# Patient Record
Sex: Female | Born: 1955
Health system: Southern US, Community
[De-identification: ages and names within clinical notes are randomized; demographics above are authoritative.]

## PROBLEM LIST (undated history)

## (undated) DIAGNOSIS — N921 Excessive and frequent menstruation with irregular cycle: Secondary | ICD-10-CM

## (undated) DIAGNOSIS — R5382 Chronic fatigue, unspecified: Secondary | ICD-10-CM

## (undated) DIAGNOSIS — I10 Essential (primary) hypertension: Secondary | ICD-10-CM

## (undated) DIAGNOSIS — R7303 Prediabetes: Secondary | ICD-10-CM

## (undated) DIAGNOSIS — E782 Mixed hyperlipidemia: Secondary | ICD-10-CM

## (undated) DIAGNOSIS — E669 Obesity, unspecified: Secondary | ICD-10-CM

## (undated) DIAGNOSIS — M419 Scoliosis, unspecified: Secondary | ICD-10-CM

## (undated) HISTORY — DX: Prediabetes: R73.03

## (undated) HISTORY — DX: Mixed hyperlipidemia: E78.2

## (undated) HISTORY — DX: Chronic fatigue, unspecified: R53.82

## (undated) HISTORY — DX: Excessive and frequent menstruation with irregular cycle: N92.1

## (undated) HISTORY — PX: WISDOM TOOTH EXTRACTION: SHX21

## (undated) HISTORY — PX: COLONOSCOPY: SHX174

## (undated) HISTORY — DX: Scoliosis, unspecified: M41.9

## (undated) HISTORY — PX: TUBAL LIGATION: SHX77

## (undated) HISTORY — DX: Obesity, unspecified: E66.9

---

## 1998-08-21 ENCOUNTER — Ambulatory Visit (HOSPITAL_COMMUNITY): Admission: RE | Admit: 1998-08-21 | Discharge: 1998-08-21 | Payer: Self-pay | Admitting: Family Medicine

## 1998-08-21 ENCOUNTER — Encounter: Payer: Self-pay | Admitting: Family Medicine

## 1999-10-26 ENCOUNTER — Encounter: Payer: Self-pay | Admitting: Specialist

## 1999-10-26 ENCOUNTER — Emergency Department (HOSPITAL_COMMUNITY): Admission: EM | Admit: 1999-10-26 | Discharge: 1999-10-26 | Payer: Self-pay | Admitting: Emergency Medicine

## 1999-10-26 ENCOUNTER — Encounter: Payer: Self-pay | Admitting: Emergency Medicine

## 1999-11-28 ENCOUNTER — Encounter: Admission: RE | Admit: 1999-11-28 | Discharge: 1999-11-28 | Payer: Self-pay | Admitting: Specialist

## 1999-11-28 ENCOUNTER — Encounter: Payer: Self-pay | Admitting: Specialist

## 2000-10-08 ENCOUNTER — Encounter: Payer: Self-pay | Admitting: Family Medicine

## 2000-10-08 ENCOUNTER — Ambulatory Visit (HOSPITAL_COMMUNITY): Admission: RE | Admit: 2000-10-08 | Discharge: 2000-10-08 | Payer: Self-pay | Admitting: Family Medicine

## 2000-11-17 ENCOUNTER — Other Ambulatory Visit: Admission: RE | Admit: 2000-11-17 | Discharge: 2000-11-17 | Payer: Self-pay | Admitting: Emergency Medicine

## 2002-03-15 ENCOUNTER — Encounter: Payer: Self-pay | Admitting: Family Medicine

## 2002-03-15 ENCOUNTER — Ambulatory Visit (HOSPITAL_COMMUNITY): Admission: RE | Admit: 2002-03-15 | Discharge: 2002-03-15 | Payer: Self-pay | Admitting: Family Medicine

## 2003-07-04 ENCOUNTER — Ambulatory Visit (HOSPITAL_COMMUNITY): Admission: RE | Admit: 2003-07-04 | Discharge: 2003-07-04 | Payer: Self-pay | Admitting: Family Medicine

## 2003-07-05 ENCOUNTER — Other Ambulatory Visit: Admission: RE | Admit: 2003-07-05 | Discharge: 2003-07-05 | Payer: Self-pay | Admitting: Family Medicine

## 2004-11-01 ENCOUNTER — Other Ambulatory Visit: Admission: RE | Admit: 2004-11-01 | Discharge: 2004-11-01 | Payer: Self-pay | Admitting: Family Medicine

## 2005-08-27 ENCOUNTER — Ambulatory Visit (HOSPITAL_COMMUNITY): Admission: RE | Admit: 2005-08-27 | Discharge: 2005-08-27 | Payer: Self-pay | Admitting: Family Medicine

## 2005-12-09 ENCOUNTER — Other Ambulatory Visit: Admission: RE | Admit: 2005-12-09 | Discharge: 2005-12-09 | Payer: Self-pay | Admitting: Family Medicine

## 2007-03-03 ENCOUNTER — Other Ambulatory Visit: Admission: RE | Admit: 2007-03-03 | Discharge: 2007-03-03 | Payer: Self-pay | Admitting: Family Medicine

## 2007-09-01 ENCOUNTER — Ambulatory Visit (HOSPITAL_COMMUNITY): Admission: RE | Admit: 2007-09-01 | Discharge: 2007-09-01 | Payer: Self-pay | Admitting: Family Medicine

## 2008-06-21 ENCOUNTER — Other Ambulatory Visit: Admission: RE | Admit: 2008-06-21 | Discharge: 2008-06-21 | Payer: Self-pay | Admitting: Family Medicine

## 2009-09-24 ENCOUNTER — Ambulatory Visit (HOSPITAL_COMMUNITY): Admission: RE | Admit: 2009-09-24 | Discharge: 2009-09-24 | Payer: Self-pay | Admitting: Family Medicine

## 2010-02-06 ENCOUNTER — Other Ambulatory Visit: Admission: RE | Admit: 2010-02-06 | Discharge: 2010-02-06 | Payer: Self-pay | Admitting: Family Medicine

## 2011-02-12 ENCOUNTER — Other Ambulatory Visit (HOSPITAL_COMMUNITY)
Admission: RE | Admit: 2011-02-12 | Discharge: 2011-02-12 | Disposition: A | Discharge status: Home / Self Care | Payer: BC Managed Care – PPO | Source: Ambulatory Visit | Attending: Family Medicine | Admitting: Family Medicine

## 2011-02-12 ENCOUNTER — Other Ambulatory Visit: Payer: Self-pay | Admitting: Family Medicine

## 2011-02-12 DIAGNOSIS — Z124 Encounter for screening for malignant neoplasm of cervix: Secondary | ICD-10-CM | POA: Insufficient documentation

## 2011-11-25 ENCOUNTER — Other Ambulatory Visit (HOSPITAL_COMMUNITY): Payer: Self-pay | Admitting: Family Medicine

## 2011-11-25 DIAGNOSIS — Z1231 Encounter for screening mammogram for malignant neoplasm of breast: Secondary | ICD-10-CM

## 2011-12-16 ENCOUNTER — Ambulatory Visit (HOSPITAL_COMMUNITY)
Admission: RE | Admit: 2011-12-16 | Discharge: 2011-12-16 | Disposition: A | Discharge status: Home / Self Care | Payer: BC Managed Care – PPO | Source: Ambulatory Visit | Attending: Family Medicine | Admitting: Family Medicine

## 2011-12-16 DIAGNOSIS — Z1231 Encounter for screening mammogram for malignant neoplasm of breast: Secondary | ICD-10-CM | POA: Insufficient documentation

## 2013-08-26 ENCOUNTER — Other Ambulatory Visit (HOSPITAL_COMMUNITY): Payer: Self-pay | Admitting: Family Medicine

## 2013-08-26 DIAGNOSIS — Z1231 Encounter for screening mammogram for malignant neoplasm of breast: Secondary | ICD-10-CM

## 2013-09-02 ENCOUNTER — Other Ambulatory Visit (HOSPITAL_COMMUNITY)
Admission: RE | Admit: 2013-09-02 | Discharge: 2013-09-02 | Disposition: A | Discharge status: Home / Self Care | Payer: BC Managed Care – PPO | Source: Ambulatory Visit | Attending: Family Medicine | Admitting: Family Medicine

## 2013-09-02 ENCOUNTER — Other Ambulatory Visit: Payer: Self-pay | Admitting: Family Medicine

## 2013-09-02 DIAGNOSIS — Z124 Encounter for screening for malignant neoplasm of cervix: Secondary | ICD-10-CM | POA: Insufficient documentation

## 2013-09-02 DIAGNOSIS — Z1151 Encounter for screening for human papillomavirus (HPV): Secondary | ICD-10-CM | POA: Insufficient documentation

## 2013-09-06 ENCOUNTER — Ambulatory Visit (HOSPITAL_COMMUNITY): Payer: BC Managed Care – PPO | Attending: Family Medicine

## 2014-02-07 ENCOUNTER — Ambulatory Visit (HOSPITAL_COMMUNITY)
Admission: RE | Admit: 2014-02-07 | Discharge: 2014-02-07 | Disposition: A | Discharge status: Home / Self Care | Payer: BC Managed Care – PPO | Source: Ambulatory Visit | Attending: Family Medicine | Admitting: Family Medicine

## 2014-02-07 DIAGNOSIS — Z1231 Encounter for screening mammogram for malignant neoplasm of breast: Secondary | ICD-10-CM | POA: Insufficient documentation

## 2014-03-20 ENCOUNTER — Other Ambulatory Visit: Payer: Self-pay | Admitting: Obstetrics & Gynecology

## 2014-03-26 NOTE — H&P (Signed)
Mary Cordova is an 58 y.o. G1P1 postmenopausal female who presents for hysteroscopy, D&C and Truclear polypectomy.  The patient reported an episode of postmenopausal bleeding in May where she had a "regular period" though slightly heavier and longer than normal. Bleeding lasted 7 days using ~ 6 pads per day with passage of grape-sized clots. The following month around the same time she had 1 day of light bleeding, no further bleeding since that time. An ultrasound was performed that showed an anteverted uterus measuring 9x5.3x5.3cm, endometrium appears thickened measuring 0.89cm by US. Three small fibroids, all smaller than 2cm. Cervix thickend with 2 hyperechoic masses 0.9x0.6cm and 1.2cm. Ovaries unremarkable bilaterally. SHG also performed- Multiple hyperechoic masses seen: anterio rwall 2.2x0.9cm, posterior fundal wall 1.1x0.6cm, LUS/cervical canal with 2 masses measuring 0.8x0.6cm and 1.3x1.1cm. Endometrial walls thin- total thickness 3.607mm. An EMB was performed that was negative for malignancy or hyperplasia. Patient presents today for management of the abnormal findings on ultrasound.  Pertinent Gynecological History: Menses: post-menopausal Bleeding: post menopausal bleeding Contraception: none DES exposure: denies Blood transfusions: none Sexually transmitted diseases: no past history Previous GYN Procedures: none  Last mammogram: normal Date: Sept 2015 Last pap: normal Date: April 2015 OB History: G1, P1   Medical History: Hypertension, Scoliosis, Elevated Cholesterol.       Medications: Taking Losartan Potassium 50 MG Tablet 1 tablet Once a day       Allergies: Penicillin (for allergy): unknown, Sulfa drugs (for allergy): unknown, ACE: cough   Surgical History: C section 1983, tubal ligation 1998.       Hospitalization/Major Diagnostic Procedure: childbirth 06/20/1981.        Family History: Father: deceased 3051 yrs, car accidentMother: deceased 9960 yrs, (+) ovarian/lung  cancerBrother 1: alive 72 yrs, A + WSister 1: alive 75 yrs, HTN, diabetes1 brother(s) , 1 sister(s) - healthy. 1daughter(s) - healthy.  sibs (+) CAD. sister - diabetes, hypertension. neg. f. hx. breast/colon cancer.       Social History:  General:  Tobacco use  cigarettes: Never smoked Tobacco history last updated 03/10/2014 no Smoking.  no Tobacco Exposure.  Alcohol: yes, liquor, Rare.  Caffeine: yes, 3 cups/day, coffee.  no Recreational drug use, no.  Diet: regular.  Exercise: yes, 3 times weekly, cardio, weights.  Occupation: employed, Psychologist, occupationalBanker.  Education: [employer].  Marital Status: married, Married.  Children: 1, girl.  Firearms: no.  Seat belt use: yes, always.     Review of Systems  Constitutional: Negative for fever, chills, weight loss and malaise/fatigue.  HENT: Negative for congestion.   Eyes: Negative for blurred vision and double vision.  Respiratory: Negative for cough, shortness of breath and wheezing.   Cardiovascular: Negative for chest pain and palpitations.  Gastrointestinal: Negative for heartburn, nausea, vomiting, abdominal pain, diarrhea and constipation.  Skin: Negative for itching and rash.  Neurological: Negative for dizziness and headaches.    Examination performed in office: 10/28 Physical Exam  Constitutional: She is oriented to person, place, and time. She appears well-developed and well-nourished.  HENT:  Head: Normocephalic and atraumatic.  Eyes: EOM are normal.  Cardiovascular: Normal rate and regular rhythm.   Respiratory: Effort normal and breath sounds normal. No respiratory distress.  GI: Soft. She exhibits no distension. There is no tenderness. There is no rebound and no guarding.  Genitourinary:  No external lesions, Vagina - pink moist mucosa, no lesions or abnormal discharge, cervix - closed, no discharge or lesions. No CMT. No adnexal masses bilaterally. Uterus: nontender and normal size on  palpation.    Musculoskeletal:  Normal range of motion. She exhibits no edema.  Neurological: She is alert and oriented to person, place, and time.  Skin: Skin is warm and dry.  Psychiatric: She has a normal mood and affect.    10/23: BMET: 137/4.1, 105/26, 15/0.6, glucose: 150 11/6: EMB negative, no evidence of hyperplasia or malignancy  Assessment/Plan: 58yo G1P1 postmenpausal female who presents for hysteroscopy, D&C and polypectomy with TruClear -NPO -LR @ 125cc/hr -Cefotan 2g IV to OR -SCDs to OR -Risk, benefit and indications reviewed with patient including risk of bleeding, infection and uterine perforation.  Pt aware and wishes to proceed with procedure.  Myna HidalgoJennifer Priscilla Finklea, DO [phone] (pager) [phone] (office)

## 2014-03-30 ENCOUNTER — Encounter (HOSPITAL_COMMUNITY): Payer: Self-pay

## 2014-03-30 ENCOUNTER — Other Ambulatory Visit: Payer: Self-pay

## 2014-03-30 ENCOUNTER — Encounter (HOSPITAL_COMMUNITY)
Admission: RE | Admit: 2014-03-30 | Discharge: 2014-03-30 | Disposition: A | Discharge status: Home / Self Care | Payer: BC Managed Care – PPO | Source: Ambulatory Visit | Attending: Obstetrics & Gynecology | Admitting: Obstetrics & Gynecology

## 2014-03-30 DIAGNOSIS — N854 Malposition of uterus: Secondary | ICD-10-CM | POA: Diagnosis not present

## 2014-03-30 DIAGNOSIS — N840 Polyp of corpus uteri: Secondary | ICD-10-CM | POA: Diagnosis not present

## 2014-03-30 DIAGNOSIS — N950 Postmenopausal bleeding: Secondary | ICD-10-CM | POA: Diagnosis present

## 2014-03-30 DIAGNOSIS — M419 Scoliosis, unspecified: Secondary | ICD-10-CM | POA: Diagnosis not present

## 2014-03-30 DIAGNOSIS — E780 Pure hypercholesterolemia: Secondary | ICD-10-CM | POA: Diagnosis not present

## 2014-03-30 DIAGNOSIS — I10 Essential (primary) hypertension: Secondary | ICD-10-CM | POA: Diagnosis not present

## 2014-03-30 HISTORY — DX: Essential (primary) hypertension: I10

## 2014-03-30 LAB — CBC
HCT: 38.4 % (ref 36.0–46.0)
HEMOGLOBIN: 13 g/dL (ref 12.0–15.0)
MCH: 29.5 pg (ref 26.0–34.0)
MCHC: 33.9 g/dL (ref 30.0–36.0)
MCV: 87.3 fL (ref 78.0–100.0)
Platelets: 251 10*3/uL (ref 150–400)
RBC: 4.4 MIL/uL (ref 3.87–5.11)
RDW: 14 % (ref 11.5–15.5)
WBC: 7.3 10*3/uL (ref 4.0–10.5)

## 2014-03-30 LAB — BASIC METABOLIC PANEL
Anion gap: 15 (ref 5–15)
BUN: 14 mg/dL (ref 6–23)
CO2: 23 meq/L (ref 19–32)
Calcium: 9.3 mg/dL (ref 8.4–10.5)
Chloride: 101 mEq/L (ref 96–112)
Creatinine, Ser: 0.5 mg/dL (ref 0.50–1.10)
GFR calc non Af Amer: >90 mL/min (ref >90)
GLUCOSE: 116 mg/dL — AB (ref 70–99)
Potassium: 4.3 mEq/L (ref 3.7–5.3)
Sodium: 139 mEq/L (ref 137–147)

## 2014-03-30 MED ORDER — DEXTROSE 5 % IV SOLN
2.0000 g | INTRAVENOUS | Status: AC
  Administered 2014-03-31: 2 g via INTRAVENOUS
  Filled 2014-03-30: qty 2

## 2014-03-30 NOTE — Patient Instructions (Addendum)
   Your procedure is scheduled on: Friday, Nov 13th  [facility] at: 10 AM Pick up the phone at the desk and dial [phone] and inform us of your arrival.  Please call this number if you have any problems the morning of surgery: [phone]  Remember: Do not eat or drink after midnight: Tonight (Thursday) Take these medicines the morning of surgery with a SIP OF WATER:  losartan  Do not wear jewelry, make-up, or FINGER nail polish No metal in your hair or on your body. Do not wear lotions, powders, perfumes.  You may wear deodorant.  Do not bring valuables to the hospital. Contacts, dentures or bridgework may not be worn into surgery.  Patients discharged on the day of surgery will not be allowed to drive home.  Home with husband Doug cell [phone].

## 2014-03-31 ENCOUNTER — Ambulatory Visit (HOSPITAL_COMMUNITY)
Admission: RE | Admit: 2014-03-31 | Discharge: 2014-03-31 | Disposition: A | Discharge status: Home / Self Care | Payer: BC Managed Care – PPO | Source: Ambulatory Visit | Attending: Obstetrics & Gynecology | Admitting: Obstetrics & Gynecology

## 2014-03-31 ENCOUNTER — Ambulatory Visit (HOSPITAL_COMMUNITY): Payer: BC Managed Care – PPO | Admitting: Registered Nurse

## 2014-03-31 ENCOUNTER — Encounter (HOSPITAL_COMMUNITY): Payer: Self-pay | Admitting: *Deleted

## 2014-03-31 ENCOUNTER — Encounter (HOSPITAL_COMMUNITY)
Admission: RE | Disposition: A | Discharge status: Home / Self Care | Payer: Self-pay | Source: Ambulatory Visit | Attending: Obstetrics & Gynecology

## 2014-03-31 DIAGNOSIS — I10 Essential (primary) hypertension: Secondary | ICD-10-CM | POA: Insufficient documentation

## 2014-03-31 DIAGNOSIS — N950 Postmenopausal bleeding: Secondary | ICD-10-CM | POA: Insufficient documentation

## 2014-03-31 DIAGNOSIS — M419 Scoliosis, unspecified: Secondary | ICD-10-CM | POA: Insufficient documentation

## 2014-03-31 DIAGNOSIS — N840 Polyp of corpus uteri: Secondary | ICD-10-CM | POA: Insufficient documentation

## 2014-03-31 DIAGNOSIS — N854 Malposition of uterus: Secondary | ICD-10-CM | POA: Insufficient documentation

## 2014-03-31 DIAGNOSIS — E780 Pure hypercholesterolemia: Secondary | ICD-10-CM | POA: Insufficient documentation

## 2014-03-31 HISTORY — PX: DILATATION & CURETTAGE/HYSTEROSCOPY WITH MYOSURE: SHX6511

## 2014-03-31 SURGERY — DILATATION & CURETTAGE/HYSTEROSCOPY WITH MYOSURE
Anesthesia: General | Site: Uterus

## 2014-03-31 MED ORDER — DEXAMETHASONE SODIUM PHOSPHATE 4 MG/ML IJ SOLN
INTRAMUSCULAR | Status: DC | PRN
  Administered 2014-03-31: 4 mg via INTRAVENOUS

## 2014-03-31 MED ORDER — KETOROLAC TROMETHAMINE 30 MG/ML IJ SOLN
INTRAMUSCULAR | Status: AC
  Filled 2014-03-31: qty 1

## 2014-03-31 MED ORDER — FENTANYL CITRATE 0.05 MG/ML IJ SOLN
INTRAMUSCULAR | Status: DC | PRN
  Administered 2014-03-31 (×2): 50 ug via INTRAVENOUS

## 2014-03-31 MED ORDER — SCOPOLAMINE 1 MG/3DAYS TD PT72
1.0000 | MEDICATED_PATCH | TRANSDERMAL | Status: DC
  Administered 2014-03-31: 1.5 mg via TRANSDERMAL

## 2014-03-31 MED ORDER — PROPOFOL 10 MG/ML IV BOLUS
INTRAVENOUS | Status: DC | PRN
  Administered 2014-03-31: 170 mg via INTRAVENOUS

## 2014-03-31 MED ORDER — FENTANYL CITRATE 0.05 MG/ML IJ SOLN: 25.0000 ug | INTRAMUSCULAR | Status: DC | PRN

## 2014-03-31 MED ORDER — DEXAMETHASONE SODIUM PHOSPHATE 4 MG/ML IJ SOLN
INTRAMUSCULAR | Status: AC
  Filled 2014-03-31: qty 1

## 2014-03-31 MED ORDER — SODIUM CHLORIDE 0.9 % IR SOLN
Status: DC | PRN
  Administered 2014-03-31: 3000 mL

## 2014-03-31 MED ORDER — PROPOFOL 10 MG/ML IV EMUL
INTRAVENOUS | Status: AC
  Filled 2014-03-31: qty 20

## 2014-03-31 MED ORDER — KETOROLAC TROMETHAMINE 30 MG/ML IJ SOLN: 15.0000 mg | Freq: Once | INTRAMUSCULAR | Status: DC | PRN

## 2014-03-31 MED ORDER — MIDAZOLAM HCL 5 MG/5ML IJ SOLN
INTRAMUSCULAR | Status: DC | PRN
  Administered 2014-03-31: 2 mg via INTRAVENOUS

## 2014-03-31 MED ORDER — ONDANSETRON HCL 4 MG/2ML IJ SOLN
INTRAMUSCULAR | Status: AC
  Filled 2014-03-31: qty 2

## 2014-03-31 MED ORDER — ONDANSETRON HCL 4 MG/2ML IJ SOLN
INTRAMUSCULAR | Status: DC | PRN
  Administered 2014-03-31: 4 mg via INTRAVENOUS

## 2014-03-31 MED ORDER — SCOPOLAMINE 1 MG/3DAYS TD PT72
MEDICATED_PATCH | TRANSDERMAL | Status: AC
  Filled 2014-03-31: qty 1

## 2014-03-31 MED ORDER — PROMETHAZINE HCL 25 MG/ML IJ SOLN: 6.2500 mg | INTRAMUSCULAR | Status: DC | PRN

## 2014-03-31 MED ORDER — LACTATED RINGERS IV SOLN
INTRAVENOUS | Status: DC
  Administered 2014-03-31 (×2): via INTRAVENOUS

## 2014-03-31 MED ORDER — KETOROLAC TROMETHAMINE 30 MG/ML IJ SOLN
INTRAMUSCULAR | Status: DC | PRN
  Administered 2014-03-31: 30 mg via INTRAVENOUS

## 2014-03-31 MED ORDER — MIDAZOLAM HCL 2 MG/2ML IJ SOLN
INTRAMUSCULAR | Status: AC
  Filled 2014-03-31: qty 2

## 2014-03-31 MED ORDER — LIDOCAINE HCL (CARDIAC) 20 MG/ML IV SOLN
INTRAVENOUS | Status: DC | PRN
  Administered 2014-03-31: 50 mg via INTRAVENOUS

## 2014-03-31 MED ORDER — MEPERIDINE HCL 25 MG/ML IJ SOLN: 6.2500 mg | INTRAMUSCULAR | Status: DC | PRN

## 2014-03-31 MED ORDER — FENTANYL CITRATE 0.05 MG/ML IJ SOLN
INTRAMUSCULAR | Status: AC
  Filled 2014-03-31: qty 2

## 2014-03-31 MED ORDER — LIDOCAINE HCL (CARDIAC) 20 MG/ML IV SOLN
INTRAVENOUS | Status: AC
  Filled 2014-03-31: qty 5

## 2014-03-31 SURGICAL SUPPLY — 18 items
BLADE INCISOR TRUC PLUS 2.9 (ABLATOR) ×1 IMPLANT
CANISTERS HI-FLOW 3000CC (CANNISTER) ×3 IMPLANT
CATH ROBINSON RED A/P 16FR (CATHETERS) ×3 IMPLANT
CLOTH BEACON ORANGE TIMEOUT ST (SAFETY) ×3 IMPLANT
CONTAINER PREFILL 10% NBF 60ML (FORM) ×6 IMPLANT
DEVICE MYOSURE CLASSIC (MISCELLANEOUS) ×2 IMPLANT
DILATOR CANAL MILEX (MISCELLANEOUS) IMPLANT
GLOVE BIOGEL PI IND STRL 6.5 (GLOVE) ×4 IMPLANT
GLOVE BIOGEL PI INDICATOR 6.5 (GLOVE) ×2
GLOVE ECLIPSE 6.5 STRL STRAW (GLOVE) ×3 IMPLANT
GOWN STRL REUS W/TWL LRG LVL3 (GOWN DISPOSABLE) ×6 IMPLANT
INCISOR TRUC PLUS BLADE 2.9 (ABLATOR)
KIT HYSTEROSCOPY TRUCLEAR (ABLATOR) ×1 IMPLANT
MORCELLATOR RECIP TRUCLEAR 4.0 (ABLATOR) ×1 IMPLANT
PACK VAGINAL MINOR WOMEN LF (CUSTOM PROCEDURE TRAY) ×3 IMPLANT
PAD OB MATERNITY 4.3X12.25 (PERSONAL CARE ITEMS) ×3 IMPLANT
SYRINGE 10CC LL (SYRINGE) ×3 IMPLANT
TOWEL OR 17X24 6PK STRL BLUE (TOWEL DISPOSABLE) ×6 IMPLANT

## 2014-03-31 NOTE — Anesthesia Postprocedure Evaluation (Signed)
  Anesthesia Post-op Note  Anesthesia Post Note  Patient: Mary Rougeamela M Cordova  Procedure(s) Performed: Procedure(s) (LRB): DILATATION & CURETTAGE/HYSTEROSCOPY WITH MYOSURE (N/A)  Anesthesia type: General  Patient location: PACU  Post pain: Pain level controlled  Post assessment: Post-op Vital signs reviewed  Last Vitals:  Filed Vitals:   03/31/14 1315  BP:   Pulse: 78  Temp:   Resp: 16    Post vital signs: Reviewed  Level of consciousness: sedated  Complications: No apparent anesthesia complications

## 2014-03-31 NOTE — Transfer of Care (Signed)
Immediate Anesthesia Transfer of Care Note  Patient: Mary Cordova  Procedure(s) Performed: Procedure(s): DILATATION & CURETTAGE/HYSTEROSCOPY WITH MYOSURE (N/A)  Patient Location: PACU  Anesthesia Type:General  Level of Consciousness: awake, alert  and oriented  Airway & Oxygen Therapy: Patient Spontanous Breathing and Patient connected to nasal cannula oxygen  Post-op Assessment: Report given to PACU RN and Post -op Vital signs reviewed and stable  Post vital signs: Reviewed and stable  Complications: No apparent anesthesia complications

## 2014-03-31 NOTE — Discharge Instructions (Signed)
HOME INSTRUCTIONS  Please note any unusual or excessive bleeding, pain, swelling. Mild dizziness or drowsiness are normal for about 24 hours after surgery.   Shower when comfortable  Restrictions: No driving for 24 hours or while taking pain medications.  Activity:  No heavy lifting (> 10 lbs), nothing in vagina (no tampons, douching, or intercourse) x 2 weeks; no tub baths for 2 weeks Vaginal spotting is expected but if your bleeding is heavy, period like,  please call the office   Diet:  You may eat whatever you want.  Do not eat large meals.  Eat small frequent meals throughout the day.  Continue to drink a good amount of water at least 6-8 glasses of water per day, hydration is very important for the healing process.  Pain Management: Take Motrin and/or Tylenol as needed for pain.  Alcohol -- Avoid for 24 hours and while taking pain medications.  Nausea: Take sips of ginger ale or soda  Fever -- Call physician if temperature over 101 degrees  Follow up:  If you do not already have a follow up appointment scheduled, please call the office at [phone].  If you experience fever (a temperature greater than 100.4), pain unrelieved by pain medication, shortness of breath, swelling of a single leg, or any other symptoms which are concerning to you please the office immediately.

## 2014-03-31 NOTE — Op Note (Signed)
Operative Report  PreOp: Postmenopausal bleeding, uterine polyps PostOp: same Procedure:  Hysteroscopy, Dilation and Curettage, Myosure polypectomy Surgeon: Dr. Myna HidalgoJennifer Gianmarco Cordova Anesthesia: General Complications:none  EBL:10cc UOP: 25cc IVF:800cc Discrepency: ~300cc  Findings:9wk sized anteverted uterus with 3 discrete polyps noted within the uterine cavity- one on the anterior wall ~ 2cm, and two smaller polyp along the left lateral wall.  Both ostia visualized Specimens: 1) endocervical curetting 2) endometrial curetting 3) uterine polyps  Procedure: The patient was taken to the operating room where she underwent general anesthesia without difficulty. The patient was placed in a low lithotomy position using Allen stirrups. She was then prepped and draped in the normal sterile fashion. The bladder was drained using a red rubber urethral catheter. A sterile speculum was inserted into the vagina. A single tooth tenaculum was placed on the anterior lip of the cervix. ECC was collected.  The uterus was then sounded to 9cm. The endocervical canal was then serially dilated to 16French using Hank dilators.  The diagnostic hysteroscope was then inserted without difficulty and noted to have the findings as listed above.Visualization was achieved using normal slaine as a distending medium. The myosure device was advised and all uterine polyps were removed in their entirety. The tissue was sent to pathology.  They hysteroscope was removed and sharp curettage was performed.  The hysteroscope was reinserted and no uterine perforation was noted.  All instruments were removed. Hemostasis was observed at the cervical site. The patient was repositioned to the supine position. The patient tolerated the procedure without any complications and taken to recovery in stable condition.   Myna HidalgoJennifer Karena Kinker, DO [phone]) [phone] (office)

## 2014-03-31 NOTE — Anesthesia Preprocedure Evaluation (Addendum)
Anesthesia Evaluation  Patient identified by MRN, date of birth, ID band Patient awake    Reviewed: Allergy & Precautions, H&P , NPO status , Patient's Chart, lab work & pertinent test results, reviewed documented beta blocker date and time   History of Anesthesia Complications Negative for: history of anesthetic complications  Airway Mallampati: II  TM Distance: <3 FB Neck ROM: full    Dental  (+) Teeth Intact   Pulmonary neg pulmonary ROS,  breath sounds clear to auscultation  Pulmonary exam normal       Cardiovascular Exercise Tolerance: Good hypertension, On Medications Rhythm:regular Rate:Normal     Neuro/Psych negative neurological ROS  negative psych ROS   GI/Hepatic negative GI ROS, Neg liver ROS,   Endo/Other  BMI 34.8  Renal/GU negative Renal ROS  Female GU complaint     Musculoskeletal   Abdominal   Peds  Hematology negative hematology ROS (+)   Anesthesia Other Findings   Reproductive/Obstetrics negative OB ROS                            Anesthesia Physical Anesthesia Plan  ASA: II  Anesthesia Plan: General LMA   Post-op Pain Management:    Induction:   Airway Management Planned:   Additional Equipment:   Intra-op Plan:   Post-operative Plan:   Informed Consent: I have reviewed the patients History and Physical, chart, labs and discussed the procedure including the risks, benefits and alternatives for the proposed anesthesia with the patient or authorized representative who has indicated his/her understanding and acceptance.   Dental Advisory Given  Plan Discussed with: CRNA and Surgeon  Anesthesia Plan Comments:         Anesthesia Quick Evaluation

## 2014-03-31 NOTE — Interval H&P Note (Signed)
History and Physical Interval Note:  03/31/2014 11:13 AM  Jenkins RougePamela M Cumber  has presented today for surgery, with the diagnosis of N84.0 Uterine Polyp R93.8 Thickened Endometrium  The various methods of treatment have been discussed with the patient and family. After consideration of risks, benefits and other options for treatment, the patient has consented to  Procedure(s): DILATATION & CURETTAGE/HYSTEROSCOPY WITH TRUCLEAR (N/A) as a surgical intervention .  The patient's history has been reviewed, patient examined, no change in status, stable for surgery.  I have reviewed the patient's chart and labs.  Questions were answered to the patient's satisfaction.     Myna HidalgoZAN, Havanna Groner, M

## 2014-04-03 ENCOUNTER — Encounter (HOSPITAL_COMMUNITY): Payer: Self-pay | Admitting: Obstetrics & Gynecology

## 2015-09-19 DIAGNOSIS — E782 Mixed hyperlipidemia: Secondary | ICD-10-CM | POA: Diagnosis not present

## 2015-09-19 DIAGNOSIS — R7309 Other abnormal glucose: Secondary | ICD-10-CM | POA: Diagnosis not present

## 2015-09-19 DIAGNOSIS — Z0000 Encounter for general adult medical examination without abnormal findings: Secondary | ICD-10-CM | POA: Diagnosis not present

## 2015-09-19 DIAGNOSIS — Z23 Encounter for immunization: Secondary | ICD-10-CM | POA: Diagnosis not present

## 2015-09-19 DIAGNOSIS — Z1211 Encounter for screening for malignant neoplasm of colon: Secondary | ICD-10-CM | POA: Diagnosis not present

## 2015-10-22 ENCOUNTER — Other Ambulatory Visit: Payer: Self-pay

## 2015-10-22 DIAGNOSIS — Z1231 Encounter for screening mammogram for malignant neoplasm of breast: Secondary | ICD-10-CM

## 2015-11-06 ENCOUNTER — Other Ambulatory Visit: Payer: Self-pay | Admitting: Family Medicine

## 2015-11-06 ENCOUNTER — Ambulatory Visit
Admission: RE | Admit: 2015-11-06 | Discharge: 2015-11-06 | Disposition: A | Discharge status: Home / Self Care | Payer: BLUE CROSS/BLUE SHIELD | Source: Ambulatory Visit

## 2015-11-06 DIAGNOSIS — Z1231 Encounter for screening mammogram for malignant neoplasm of breast: Secondary | ICD-10-CM

## 2016-08-29 DIAGNOSIS — H2513 Age-related nuclear cataract, bilateral: Secondary | ICD-10-CM | POA: Diagnosis not present

## 2016-09-16 DIAGNOSIS — H16403 Unspecified corneal neovascularization, bilateral: Secondary | ICD-10-CM | POA: Diagnosis not present

## 2016-09-26 ENCOUNTER — Other Ambulatory Visit: Payer: Self-pay | Admitting: Family Medicine

## 2016-09-26 ENCOUNTER — Other Ambulatory Visit (HOSPITAL_COMMUNITY)
Admission: RE | Admit: 2016-09-26 | Discharge: 2016-09-26 | Disposition: A | Discharge status: Home / Self Care | Payer: BLUE CROSS/BLUE SHIELD | Source: Ambulatory Visit | Attending: Family Medicine | Admitting: Family Medicine

## 2016-09-26 DIAGNOSIS — I10 Essential (primary) hypertension: Secondary | ICD-10-CM | POA: Diagnosis not present

## 2016-09-26 DIAGNOSIS — R7303 Prediabetes: Secondary | ICD-10-CM | POA: Diagnosis not present

## 2016-09-26 DIAGNOSIS — Z01411 Encounter for gynecological examination (general) (routine) with abnormal findings: Secondary | ICD-10-CM | POA: Insufficient documentation

## 2016-09-26 DIAGNOSIS — R5382 Chronic fatigue, unspecified: Secondary | ICD-10-CM | POA: Diagnosis not present

## 2016-09-26 DIAGNOSIS — E782 Mixed hyperlipidemia: Secondary | ICD-10-CM | POA: Diagnosis not present

## 2016-09-26 DIAGNOSIS — Z1211 Encounter for screening for malignant neoplasm of colon: Secondary | ICD-10-CM | POA: Diagnosis not present

## 2016-09-26 DIAGNOSIS — Z1151 Encounter for screening for human papillomavirus (HPV): Secondary | ICD-10-CM | POA: Diagnosis not present

## 2016-09-26 DIAGNOSIS — Z0000 Encounter for general adult medical examination without abnormal findings: Secondary | ICD-10-CM | POA: Diagnosis not present

## 2016-09-30 LAB — CYTOLOGY - PAP
Diagnosis: NEGATIVE
HPV (WINDOPATH): NOT DETECTED

## 2016-10-30 DIAGNOSIS — H16403 Unspecified corneal neovascularization, bilateral: Secondary | ICD-10-CM | POA: Diagnosis not present

## 2017-01-29 DIAGNOSIS — H16403 Unspecified corneal neovascularization, bilateral: Secondary | ICD-10-CM | POA: Diagnosis not present

## 2017-07-21 ENCOUNTER — Other Ambulatory Visit: Payer: Self-pay | Admitting: Family Medicine

## 2017-07-21 DIAGNOSIS — Z1231 Encounter for screening mammogram for malignant neoplasm of breast: Secondary | ICD-10-CM

## 2017-08-17 ENCOUNTER — Ambulatory Visit
Admission: RE | Admit: 2017-08-17 | Discharge: 2017-08-17 | Disposition: A | Discharge status: Home / Self Care | Payer: BLUE CROSS/BLUE SHIELD | Source: Ambulatory Visit | Attending: Family Medicine | Admitting: Family Medicine

## 2017-08-17 DIAGNOSIS — Z1231 Encounter for screening mammogram for malignant neoplasm of breast: Secondary | ICD-10-CM | POA: Diagnosis not present

## 2017-10-28 DIAGNOSIS — I10 Essential (primary) hypertension: Secondary | ICD-10-CM | POA: Diagnosis not present

## 2017-10-28 DIAGNOSIS — R7303 Prediabetes: Secondary | ICD-10-CM | POA: Diagnosis not present

## 2017-10-28 DIAGNOSIS — Z0000 Encounter for general adult medical examination without abnormal findings: Secondary | ICD-10-CM | POA: Diagnosis not present

## 2017-10-28 DIAGNOSIS — E782 Mixed hyperlipidemia: Secondary | ICD-10-CM | POA: Diagnosis not present

## 2017-11-01 DIAGNOSIS — Z1211 Encounter for screening for malignant neoplasm of colon: Secondary | ICD-10-CM | POA: Diagnosis not present

## 2017-11-23 DIAGNOSIS — M180 Bilateral primary osteoarthritis of first carpometacarpal joints: Secondary | ICD-10-CM | POA: Diagnosis not present

## 2017-11-23 DIAGNOSIS — M1812 Unilateral primary osteoarthritis of first carpometacarpal joint, left hand: Secondary | ICD-10-CM | POA: Diagnosis not present

## 2017-12-01 DIAGNOSIS — T7840XA Allergy, unspecified, initial encounter: Secondary | ICD-10-CM | POA: Diagnosis not present

## 2017-12-14 DIAGNOSIS — R195 Other fecal abnormalities: Secondary | ICD-10-CM | POA: Diagnosis not present

## 2017-12-14 DIAGNOSIS — R6889 Other general symptoms and signs: Secondary | ICD-10-CM | POA: Diagnosis not present

## 2017-12-22 DIAGNOSIS — K635 Polyp of colon: Secondary | ICD-10-CM | POA: Diagnosis not present

## 2017-12-22 DIAGNOSIS — R195 Other fecal abnormalities: Secondary | ICD-10-CM | POA: Diagnosis not present

## 2017-12-22 DIAGNOSIS — D126 Benign neoplasm of colon, unspecified: Secondary | ICD-10-CM | POA: Diagnosis not present

## 2017-12-25 DIAGNOSIS — D126 Benign neoplasm of colon, unspecified: Secondary | ICD-10-CM | POA: Diagnosis not present

## 2017-12-25 DIAGNOSIS — K635 Polyp of colon: Secondary | ICD-10-CM | POA: Diagnosis not present

## 2018-02-23 DIAGNOSIS — R399 Unspecified symptoms and signs involving the genitourinary system: Secondary | ICD-10-CM | POA: Diagnosis not present

## 2018-03-11 DIAGNOSIS — H40053 Ocular hypertension, bilateral: Secondary | ICD-10-CM | POA: Diagnosis not present

## 2018-03-19 DIAGNOSIS — Z23 Encounter for immunization: Secondary | ICD-10-CM | POA: Diagnosis not present

## 2018-11-30 DIAGNOSIS — S96911A Strain of unspecified muscle and tendon at ankle and foot level, right foot, initial encounter: Secondary | ICD-10-CM | POA: Diagnosis not present

## 2019-01-11 DIAGNOSIS — R202 Paresthesia of skin: Secondary | ICD-10-CM | POA: Diagnosis not present

## 2019-01-11 DIAGNOSIS — E538 Deficiency of other specified B group vitamins: Secondary | ICD-10-CM | POA: Diagnosis not present

## 2019-01-11 DIAGNOSIS — E782 Mixed hyperlipidemia: Secondary | ICD-10-CM | POA: Diagnosis not present

## 2019-01-11 DIAGNOSIS — I10 Essential (primary) hypertension: Secondary | ICD-10-CM | POA: Diagnosis not present

## 2019-01-17 DIAGNOSIS — I10 Essential (primary) hypertension: Secondary | ICD-10-CM | POA: Diagnosis not present

## 2019-01-17 DIAGNOSIS — E782 Mixed hyperlipidemia: Secondary | ICD-10-CM | POA: Diagnosis not present

## 2019-01-17 DIAGNOSIS — R5382 Chronic fatigue, unspecified: Secondary | ICD-10-CM | POA: Diagnosis not present

## 2019-01-17 DIAGNOSIS — R7303 Prediabetes: Secondary | ICD-10-CM | POA: Diagnosis not present

## 2019-01-17 DIAGNOSIS — E538 Deficiency of other specified B group vitamins: Secondary | ICD-10-CM | POA: Diagnosis not present

## 2019-01-17 DIAGNOSIS — Z5181 Encounter for therapeutic drug level monitoring: Secondary | ICD-10-CM | POA: Diagnosis not present

## 2019-01-19 DIAGNOSIS — Z0000 Encounter for general adult medical examination without abnormal findings: Secondary | ICD-10-CM | POA: Diagnosis not present

## 2019-01-19 DIAGNOSIS — Z23 Encounter for immunization: Secondary | ICD-10-CM | POA: Diagnosis not present

## 2019-03-01 DIAGNOSIS — H40013 Open angle with borderline findings, low risk, bilateral: Secondary | ICD-10-CM | POA: Diagnosis not present

## 2019-03-03 ENCOUNTER — Other Ambulatory Visit: Payer: Self-pay

## 2019-03-03 ENCOUNTER — Encounter: Payer: Self-pay | Admitting: Neurology

## 2019-03-03 ENCOUNTER — Ambulatory Visit: Payer: BC Managed Care – PPO | Admitting: Neurology

## 2019-03-03 VITALS — BP 132/90 | HR 80 | Temp 97.6°F | Ht 60.0 in | Wt 178.0 lb

## 2019-03-03 DIAGNOSIS — R5382 Chronic fatigue, unspecified: Secondary | ICD-10-CM

## 2019-03-03 DIAGNOSIS — R200 Anesthesia of skin: Secondary | ICD-10-CM

## 2019-03-03 DIAGNOSIS — E782 Mixed hyperlipidemia: Secondary | ICD-10-CM

## 2019-03-03 DIAGNOSIS — R7303 Prediabetes: Secondary | ICD-10-CM

## 2019-03-03 DIAGNOSIS — E6609 Other obesity due to excess calories: Secondary | ICD-10-CM

## 2019-03-03 DIAGNOSIS — R202 Paresthesia of skin: Secondary | ICD-10-CM

## 2019-03-03 DIAGNOSIS — I10 Essential (primary) hypertension: Secondary | ICD-10-CM

## 2019-03-03 NOTE — Patient Instructions (Signed)
EMG/NCS end of January MRI brain beginning to mid January   Peripheral Neuropathy Peripheral neuropathy is a type of nerve damage. It affects nerves that carry signals between the spinal cord and the arms, legs, and the rest of the body (peripheral nerves). It does not affect nerves in the spinal cord or brain. In peripheral neuropathy, one nerve or a group of nerves may be damaged. Peripheral neuropathy is a broad category that includes many specific nerve disorders, like diabetic neuropathy, hereditary neuropathy, and carpal tunnel syndrome. What are the causes? This condition may be caused by:  Diabetes. This is the most common cause of peripheral neuropathy.  Nerve injury.  Pressure or stress on a nerve that lasts a long time.  Lack (deficiency) of B vitamins. This can result from alcoholism, poor diet, or a restricted diet.  Infections.  Autoimmune diseases, such as rheumatoid arthritis and systemic lupus erythematosus.  Nerve diseases that are passed from parent to child (inherited).  Some medicines, such as cancer medicines (chemotherapy).  Poisonous (toxic) substances, such as lead and mercury.  Too little blood flowing to the legs.  Kidney disease.  Thyroid disease. In some cases, the cause of this condition is not known. What are the signs or symptoms? Symptoms of this condition depend on which of your nerves is damaged. Common symptoms include:  Loss of feeling (numbness) in the feet, hands, or both.  Tingling in the feet, hands, or both.  Burning pain.  Very sensitive skin.  Weakness.  Not being able to move a part of the body (paralysis).  Muscle twitching.  Clumsiness or poor coordination.  Loss of balance.  Not being able to control your bladder.  Feeling dizzy.  Sexual problems. How is this diagnosed? Diagnosing and finding the cause of peripheral neuropathy can be difficult. Your health care provider will take your medical history and do a  physical exam. A neurological exam will also be done. This involves checking things that are affected by your brain, spinal cord, and nerves (nervous system). For example, your health care provider will check your reflexes, how you move, and what you can feel. You may have other tests, such as:  Blood tests.  Electromyogram (EMG) and nerve conduction tests. These tests check nerve function and how well the nerves are controlling the muscles.  Imaging tests, such as CT scans or MRI to rule out other causes of your symptoms.  Removing a small piece of nerve to be examined in a lab (nerve biopsy). This is rare.  Removing and examining a small amount of the fluid that surrounds the brain and spinal cord (lumbar puncture). This is rare. How is this treated? Treatment for this condition may involve:  Treating the underlying cause of the neuropathy, such as diabetes, kidney disease, or vitamin deficiencies.  Stopping medicines that can cause neuropathy, such as chemotherapy.  Medicine to relieve pain. Medicines may include: ? Prescription or over-the-counter pain medicine. ? Antiseizure medicine. ? Antidepressants. ? Pain-relieving patches that are applied to painful areas of skin.  Surgery to relieve pressure on a nerve or to destroy a nerve that is causing pain.  Physical therapy to help improve movement and balance.  Devices to help you move around (assistive devices). Follow these instructions at home: Medicines  Take over-the-counter and prescription medicines only as told by your health care provider. Do not take any other medicines without first asking your health care provider.  Do not drive or use heavy machinery while taking prescription pain  medicine. Lifestyle   Do not use any products that contain nicotine or tobacco, such as cigarettes and e-cigarettes. Smoking keeps blood from reaching damaged nerves. If you need help quitting, ask your health care provider.  Avoid or  limit alcohol. Too much alcohol can cause a vitamin B deficiency, and vitamin B is needed for healthy nerves.  Eat a healthy diet. This includes: ? Eating foods that are high in fiber, such as fresh fruits and vegetables, whole grains, and beans. ? Limiting foods that are high in fat and processed sugars, such as fried or sweet foods. General instructions   If you have diabetes, work closely with your health care provider to keep your blood sugar under control.  If you have numbness in your feet: ? Check every day for signs of injury or infection. Watch for redness, warmth, and swelling. ? Wear padded socks and comfortable shoes. These help protect your feet.  Develop a good support system. Living with peripheral neuropathy can be stressful. Consider talking with a mental health specialist or joining a support group.  Use assistive devices and attend physical therapy as told by your health care provider. This may include using a walker or a cane.  Keep all follow-up visits as told by your health care provider. This is important. Contact a health care provider if:  You have new signs or symptoms of peripheral neuropathy.  You are struggling emotionally from dealing with peripheral neuropathy.  Your pain is not well-controlled. Get help right away if:  You have an injury or infection that is not healing normally.  You develop new weakness in an arm or leg.  You fall frequently. Summary  Peripheral neuropathy is when the nerves in the arms, or legs are damaged, resulting in numbness, weakness, or pain.  There are many causes of peripheral neuropathy, including diabetes, pinched nerves, vitamin deficiencies, autoimmune disease, and hereditary conditions.  Diagnosing and finding the cause of peripheral neuropathy can be difficult. Your health care provider will take your medical history, do a physical exam, and do tests, including blood tests and nerve function tests.  Treatment  involves treating the underlying cause of the neuropathy and taking medicines to help control pain. Physical therapy and assistive devices may also help. This information is not intended to replace advice given to you by your health care provider. Make sure you discuss any questions you have with your health care provider. Document Released: 04/25/2002 Document Revised: 04/17/2017 Document Reviewed: 07/14/2016 Elsevier Patient Education  2020 [employer]   EMG/NCS Electromyoneurogram Electromyoneurogram is a test to check how well your muscles and nerves are working. This procedure includes the combined use of electromyogram (EMG) and nerve conduction study (NCS). EMG is used to look for muscular disorders. NCS, which is also called electroneurogram, measures how well your nerves are controlling your muscles. The procedures are usually done together to check if your muscles and nerves are healthy. If the results of the tests are abnormal, this may indicate disease or injury, such as a neuromuscular disease or peripheral nerve damage. Tell a health care provider about:  Any allergies you have.  All medicines you are taking, including vitamins, herbs, eye drops, creams, and over-the-counter medicines.  Any problems you or family members have had with anesthetic medicines.  Any blood disorders you have.  Any surgeries you have had.  Any medical conditions you have.  If you have a pacemaker.  Whether you are pregnant or may be pregnant. What are the risks?  Generally, this is a safe procedure. However, problems may occur, including:  Infection where the electrodes were inserted.  Bleeding. What happens before the procedure? Medicines Ask your health care provider about:  Changing or stopping your regular medicines. This is especially important if you are taking diabetes medicines or blood thinners.  Taking medicines such as aspirin and ibuprofen. These medicines can thin your  blood. Do not take these medicines unless your health care provider tells you to take them.  Taking over-the-counter medicines, vitamins, herbs, and supplements. General instructions  Your health care provider may ask you to avoid: ? Beverages that have caffeine, such as coffee and tea. ? Any products that contain nicotine or tobacco. These products include cigarettes, e-cigarettes, and chewing tobacco. If you need help quitting, ask your health care provider.  Do not use lotions or creams on the same day that you will be having the procedure. What happens during the procedure? For EMG   Your health care provider will ask you to stay in a position so that he or she can access the muscle that will be studied. You may be standing, sitting, or lying down.  You may be given a medicine that numbs the area (local anesthetic).  A very thin needle that has an electrode will be inserted into your muscle.  Another small electrode will be placed on your skin near the muscle.  Your health care provider will ask you to continue to remain still.  The electrodes will send a signal that tells about the electrical activity of your muscles. You may see this on a monitor or hear it in the room.  After your muscles have been studied at rest, your health care provider will ask you to contract or flex your muscles. The electrodes will send a signal that tells about the electrical activity of your muscles.  Your health care provider will remove the electrodes and the electrode needles when the procedure is finished. The procedure may vary among health care providers and hospitals. For NCS   An electrode that records your nerve activity (recording electrode) will be placed on your skin by the muscle that is being studied.  An electrode that is used as a reference (reference electrode) will be placed near the recording electrode.  A paste or gel will be applied to your skin between the recording  electrode and the reference electrode.  Your nerve will be stimulated with a mild shock. Your health care provider will measure how much time it takes for your muscle to react.  Your health care provider will remove the electrodes and the gel when the procedure is finished. The procedure may vary among health care providers and hospitals. What happens after the procedure?  It is up to you to get the results of your procedure. Ask your health care provider, or the department that is doing the procedure, when your results will be ready.  Your health care provider may: ? Give you medicines for any pain. ? Monitor the insertion sites to make sure that bleeding stops. Summary  Electromyoneurogram is a test to check how well your muscles and nerves are working.  If the results of the tests are abnormal, this may indicate disease or injury.  This is a safe procedure. However, problems may occur, such as bleeding and infection.  Your health care provider will do two tests to complete this procedure. One checks your muscles (EMG) and another checks your nerves (NCS).  It is up to you  to get the results of your procedure. Ask your health care provider, or the department that is doing the procedure, when your results will be ready. This information is not intended to replace advice given to you by your health care provider. Make sure you discuss any questions you have with your health care provider. Document Released: 09/05/2004 Document Revised: 01/19/2018 Document Reviewed: 01/01/2018 Elsevier Patient Education  2020 [employer].

## 2019-03-03 NOTE — Progress Notes (Signed)
[facility]    Provider:  Dr Lucia GaskinsAhern Requesting Provider: Shirlean MylarWebb, Carol, MD Primary Care Provider:  Shirlean MylarWebb, Carol, MD  CC:  Paesthesias  HPI:  Mary Rougeamela M Cordova is a 63 y.o. female here as requested by Shirlean MylarWebb, Carol, MD for paresthesias of the right hand and foot.Marland Kitchen. PMHx chronic fatigue, distal paresthesias, obesity, essential hypertension, prediabetes, mixed hyperlipidemia. Mid July she hd a cramp in her right foot, it would not go away, they said she pulled something, happened 2 other times and one of those were severe around the ankle  And now she has numbness in the 1st and 2nd toes. She also had a sensation the whole right side. The right palm feels "strange", it is the palm and she has numbness above the right knee is tingly in a patch the size of her palm. At times she feels there is numbness on the face like coming off of a dental procedure. Always on the right side. Left foot unaffected, no back pain or radicular pain, but when the cramping came up she had her right leg crossed. Nothing mkes the right foot better or worse and is continuous, no pain, the right pain is always a little tingly and right 3 fingers.   Reviewed notes, labs and imaging from outside physicians, which showed:  I reviewed Dr. Marland McalpineWebb's notes.  Patient has paresthesias of the right hand and foot.  Dr. Hyman HopesWebb notes stated neuropathy versus ruling out multiple sclerosis or stroke or other etiology.  [facility]  Patient was seen by orthopedic surgery July 2019 with a chief complaint of hand pain.  She is right-hand dominant, pain in left hand, basilar area of her thumb going on several months, no history of injury, aching pain, pinching gripping and putting her seatbelt pulling up her pants increased symptoms.  She has not tried anything other than ibuprofen.  Nothing seems to make it better.  Relatively constant.  No history of diabetes, thyroid problems arthritis or gout.  Family history is  positive for diabetes.  She was diagnosed with primary osteoarthritis of both the first carpometacarpal joints via plain x-rays, they braced her with soft hand [facility] splints, sent to therapy.  Review of Systems: Patient complains of symptoms per HPI as well as the following symptoms: tingling, numbness. Pertinent negatives and positives per HPI. All others negative.   Social History   Socioeconomic History  . Marital status: Married    Spouse name: Not on file  . Number of children: 1  . Years of education: Not on file  . Highest education level: Bachelor's degree (e.g., BA, AB, BS)  Occupational History  . Not on file  Social Needs  . Financial resource strain: Not on file  . Food insecurity    Worry: Not on file    Inability: Not on file  . Transportation needs    Medical: Not on file    Non-medical: Not on file  Tobacco Use  . Smoking status: Never Smoker  . Smokeless tobacco: Never Used  Substance and Sexual Activity  . Alcohol use: Yes    Comment: rare  . Drug use: No  . Sexual activity: Yes    Birth control/protection: Post-menopausal  Lifestyle  . Physical activity    Days per week: Not on file    Minutes per session: Not on file  . Stress: Not on file  Relationships  . Social connections    Talks on phone: Not on file  Gets together: Not on file    Attends religious service: Not on file    Active member of club or organization: Not on file    Attends meetings of clubs or organizations: Not on file    Relationship status: Not on file  . Intimate partner violence    Fear of current or ex partner: Not on file    Emotionally abused: Not on file    Physically abused: Not on file    Forced sexual activity: Not on file  Other Topics Concern  . Not on file  Social History Narrative   Lives at home with husband   Right handed    Family History  Problem Relation Age of Onset  . Cancer Mother   . Ovarian cancer Sister     Past Medical History:  Diagnosis  Date  . Chronic fatigue    pt unsure where this came from  . Hypertension   . Menometrorrhagia   . Mixed hyperlipidemia   . Obesity, unspecified   . Prediabetes   . Scoliosis     Patient Active Problem List   Diagnosis Date Noted  . Paresthesias 03/06/2019  . Prediabetes   . Obesity, unspecified   . Mixed hyperlipidemia   . Hypertension   . Chronic fatigue     Past Surgical History:  Procedure Laterality Date  . CESAREAN SECTION     x 1  . COLONOSCOPY    . DILATATION & CURETTAGE/HYSTEROSCOPY WITH MYOSURE N/A 03/31/2014   Procedure: DILATATION & CURETTAGE/HYSTEROSCOPY WITH MYOSURE;  Surgeon: Sharon Seller, DO;  Location: [facility];  Service: Gynecology;  Laterality: N/A;  . TUBAL LIGATION    . WISDOM TOOTH EXTRACTION      Current Outpatient Medications  Medication Sig Dispense Refill  . cetirizine (ZYRTEC) 10 MG tablet Take 10 mg by mouth daily.    Marland Kitchen losartan (COZAAR) 50 MG tablet Take 50 mg by mouth daily.     No current facility-administered medications for this visit.     Allergies as of 03/03/2019 - Review Complete 03/03/2019  Allergen Reaction Noted  . Ace inhibitors  03/27/2014  . Penicillins Other (See Comments) 03/27/2014  . Omeprazole Rash 03/03/2019  . Sulfa antibiotics Rash 03/27/2014    Vitals: BP 132/90 (BP Location: Right Arm, Patient Position: Sitting)   Pulse 80   Temp 97.6 F (36.4 C)   Ht 5' (1.524 m)   Wt 178 lb (80.7 kg)   BMI 34.76 kg/m  Last Weight:  Wt Readings from Last 1 Encounters:  03/03/19 178 lb (80.7 kg)   Last Height:   Ht Readings from Last 1 Encounters:  03/03/19 5' (1.524 m)     Physical exam: Exam: Gen: NAD, conversant, well nourised, obese, well groomed                     CV: RRR, no MRG. No Carotid Bruits. No peripheral edema, warm, nontender Eyes: Conjunctivae clear without exudates or hemorrhage  Neuro: Detailed Neurologic Exam  Speech:    Speech is normal; fluent and spontaneous with normal  comprehension.  Cognition:    The patient is oriented to person, place, and time;     recent and remote memory intact;     language fluent;     normal attention, concentration,     fund of knowledge Cranial Nerves:    The pupils are equal, round, and reactive to light. The fundi are normal and spontaneous venous pulsations are present. Visual  fields are full to finger confrontation. Extraocular movements are intact. Trigeminal sensation is intact and the muscles of mastication are normal. The face is symmetric(mild asymmetry ot the lids may be incidental no ptosis). The palate elevates in the midline. Hearing intact. Voice is normal. Shoulder shrug is normal. The tongue has normal motion without fasciculations.   Coordination:    Normal finger to nose and heel to shin. Normal rapid alternating movements.   Gait:    Heel-toe and tandem gait are normal.   Motor Observation:    No asymmetry, no atrophy, and no involuntary movements noted. Tone:    Normal muscle tone.    Posture:    Posture is normal. normal erect    Strength:    Strength is V/V in the upper and lower limbs.      Sensation: intact to LT     Reflex Exam:  DTR's:    Deep tendon reflexes in the upper and lower extremities are normal bilaterally.   Toes:    The toes are downgoing bilaterally.   Clonus:    Clonus is absent.    Assessment/Plan:  Patient with right-sided paresthesias. Needs thorough eval. Risk factors include pre-diabetes, B12 deficiancy(recently started B12 suplementation).  MRI brain : eval for stroke, demyelinating lesions, mass, seizure focus. Emg/ncs of the right hand and foot.- check for peroneal neuropathy of lower right leg vs radic or polyneuropathy. Check cts vs radic of the right arm.     Orders Placed This Encounter  Procedures  . MR BRAIN W WO CONTRAST  . NCV with EMG(electromyography)    Cc: Maurice Small, MD,  Sarina Ill, MD  [facility] [address] [place], [place] [address]  Phone [phone] Fax [phone]

## 2019-03-06 ENCOUNTER — Encounter: Payer: Self-pay | Admitting: Neurology

## 2019-03-06 DIAGNOSIS — R202 Paresthesia of skin: Secondary | ICD-10-CM | POA: Insufficient documentation

## 2019-03-06 DIAGNOSIS — I10 Essential (primary) hypertension: Secondary | ICD-10-CM | POA: Insufficient documentation

## 2019-03-06 DIAGNOSIS — R5382 Chronic fatigue, unspecified: Secondary | ICD-10-CM | POA: Insufficient documentation

## 2019-03-06 DIAGNOSIS — R7303 Prediabetes: Secondary | ICD-10-CM | POA: Insufficient documentation

## 2019-03-06 DIAGNOSIS — E782 Mixed hyperlipidemia: Secondary | ICD-10-CM | POA: Insufficient documentation

## 2019-03-06 DIAGNOSIS — E669 Obesity, unspecified: Secondary | ICD-10-CM | POA: Insufficient documentation

## 2019-03-08 ENCOUNTER — Telehealth: Payer: Self-pay | Admitting: Neurology

## 2019-03-08 NOTE — Telephone Encounter (Signed)
I spoke with the patient and informed her it would cost her about $808.85. she stated she will think about it and might wait because at the beginning of the year she is going to get Medicare and so it may be cheaper for her then.

## 2019-03-08 NOTE — Telephone Encounter (Signed)
LVM for pt to call back about scheduling mri  [employer] Auth: [id number] (exp. 03/07/19 to 09/02/19)

## 2019-03-08 NOTE — Telephone Encounter (Signed)
Phone rep checked office voicemail;at 11:52 a.m. pt returned the call to [facility], please return the call to pt at [phone]

## 2019-03-16 DIAGNOSIS — Z1159 Encounter for screening for other viral diseases: Secondary | ICD-10-CM | POA: Diagnosis not present

## 2019-03-17 DIAGNOSIS — M9904 Segmental and somatic dysfunction of sacral region: Secondary | ICD-10-CM | POA: Diagnosis not present

## 2019-03-17 DIAGNOSIS — M9903 Segmental and somatic dysfunction of lumbar region: Secondary | ICD-10-CM | POA: Diagnosis not present

## 2019-03-17 DIAGNOSIS — M5127 Other intervertebral disc displacement, lumbosacral region: Secondary | ICD-10-CM | POA: Diagnosis not present

## 2019-03-17 DIAGNOSIS — M9905 Segmental and somatic dysfunction of pelvic region: Secondary | ICD-10-CM | POA: Diagnosis not present

## 2019-03-21 DIAGNOSIS — K5730 Diverticulosis of large intestine without perforation or abscess without bleeding: Secondary | ICD-10-CM | POA: Diagnosis not present

## 2019-03-21 DIAGNOSIS — K635 Polyp of colon: Secondary | ICD-10-CM | POA: Diagnosis not present

## 2019-03-21 DIAGNOSIS — Z86010 Personal history of colonic polyps: Secondary | ICD-10-CM | POA: Diagnosis not present

## 2019-03-21 DIAGNOSIS — K648 Other hemorrhoids: Secondary | ICD-10-CM | POA: Diagnosis not present

## 2019-03-24 DIAGNOSIS — M9904 Segmental and somatic dysfunction of sacral region: Secondary | ICD-10-CM | POA: Diagnosis not present

## 2019-03-24 DIAGNOSIS — M9905 Segmental and somatic dysfunction of pelvic region: Secondary | ICD-10-CM | POA: Diagnosis not present

## 2019-03-24 DIAGNOSIS — M9903 Segmental and somatic dysfunction of lumbar region: Secondary | ICD-10-CM | POA: Diagnosis not present

## 2019-03-24 DIAGNOSIS — M5127 Other intervertebral disc displacement, lumbosacral region: Secondary | ICD-10-CM | POA: Diagnosis not present

## 2019-03-31 DIAGNOSIS — Z23 Encounter for immunization: Secondary | ICD-10-CM | POA: Diagnosis not present

## 2019-06-07 NOTE — Telephone Encounter (Signed)
I spoke with the patient and discussed Dr. Trevor Mace message. Pt verbalized understanding. She said the reason the MRI was held was due to her getting new insurance. The pt states she now has Medicare and provided the information below. She will await a call back from MRI referrals to schedule.   Medicare ID: [id number]  [employer] [facility] [employer] Coverage #: [id number] Medicare Supplement Plan G

## 2019-06-07 NOTE — Telephone Encounter (Signed)
The MRI is not necessarily needed before the emg but I would like her to get it as soon as possible, would actually be nice to have it before the emg/ncs so we can discuss it in conjunction with the emg findings. But if she cannot schedule it prior due to availability or her schedule we can still proceed with emgncs

## 2019-06-07 NOTE — Telephone Encounter (Signed)
Patient called in and wanted to know if she needs the MRI done and resulted before her appt on 06/16/2019

## 2019-06-08 NOTE — Telephone Encounter (Signed)
Patient sent to GI via email. DWD  

## 2019-06-16 ENCOUNTER — Encounter: Payer: BC Managed Care – PPO | Admitting: Neurology

## 2019-06-22 ENCOUNTER — Ambulatory Visit
Admission: RE | Admit: 2019-06-22 | Discharge: 2019-06-22 | Disposition: A | Discharge status: Home / Self Care | Payer: Medicare Other | Source: Ambulatory Visit | Attending: Neurology | Admitting: Neurology

## 2019-06-22 ENCOUNTER — Other Ambulatory Visit: Payer: Self-pay

## 2019-06-22 DIAGNOSIS — R200 Anesthesia of skin: Secondary | ICD-10-CM

## 2019-06-22 DIAGNOSIS — R202 Paresthesia of skin: Secondary | ICD-10-CM

## 2019-06-22 DIAGNOSIS — R5382 Chronic fatigue, unspecified: Secondary | ICD-10-CM

## 2019-06-22 MED ORDER — GADOBENATE DIMEGLUMINE 529 MG/ML IV SOLN
16.0000 mL | Freq: Once | INTRAVENOUS | Status: AC | PRN
  Administered 2019-06-22: 16 mL via INTRAVENOUS

## 2019-06-23 NOTE — Progress Notes (Signed)
MRI brain looks great, normal for age thanks

## 2019-06-27 ENCOUNTER — Telehealth: Payer: Self-pay | Admitting: *Deleted

## 2019-06-27 NOTE — Telephone Encounter (Signed)
Spoke with pt and let her know her MRI brain is normal and looks great, normal for age. Pt question answered. She understands she can view images if she likes at her EMG/NCV next month. She verbalized appreciation.

## 2019-06-27 NOTE — Telephone Encounter (Signed)
-----   Message from Anson Fret, MD sent at 06/23/2019 12:33 PM EST ----- MRI brain looks great, normal for age thanks

## 2019-07-21 ENCOUNTER — Ambulatory Visit (INDEPENDENT_AMBULATORY_CARE_PROVIDER_SITE_OTHER): Payer: Medicare Other | Admitting: Neurology

## 2019-07-21 ENCOUNTER — Other Ambulatory Visit: Payer: Self-pay

## 2019-07-21 DIAGNOSIS — Z0289 Encounter for other administrative examinations: Secondary | ICD-10-CM

## 2019-07-21 DIAGNOSIS — R202 Paresthesia of skin: Secondary | ICD-10-CM

## 2019-07-21 NOTE — Progress Notes (Addendum)
History: Paresthesias and pain of the right hand digits 1-3 and palm as well as right after she "pulled something" and had a cramp she experience tingling in a small patch of area the size of her palm above the right knee and nubnesss in toes 1-2. MRI of the brain was normal, also reported some sensory changes in the face. She saw orthopaedic surgery in the past, diagnosed with primary arthritis of carpometacacarpal joints and was sent to therapy.   Discussed her case.  I think possibly her right hand is a mild CTS that may be undetectable at this time on emg/ncs. If it worsens would return for repeat, in the meantime wear CTS brace and other conservative measures. Similarly the right toe numbess may be a very mild peroneal neuropathy too mild for detection on emg/ncs if it worsens I recommend ultrasound of the knee and xray of the knee to rule out any cysts or bony abnormalities but given the trigger of crossing legs may have been a compression or stretch nerve injury; clinical monitoring recommended. No suggestion of cervical or lumbar radiculopathy at this time on exam or testing however again if very mild may evade detection on this test, if starting to have more radicular symptoms in either the neck or the low back recommend MRI cervical spine and/or lumbar spine. Luckily MRI of the brain did not show any etiology such as stroke.   A total of 15 minutes was spent on this patient's care face-to-face with patient, reviewing imaging, past records, recent hospitalization notes and results. Over half this time was spent on counseling patient on the  1. Paresthesias    diagnosis and different diagnostic and therapeutic options, counseling and coordination of care, risks and benefitsof management, compliance, or risk factor reduction and education.  This does no include time spent on emg/ncs procedure.  Cc: Dr. Shirlean Mylar

## 2019-07-21 NOTE — Patient Instructions (Addendum)
I think possibly her right hand is a mild CTS that may be undetectable at this time on emg/ncs. If it worsens would return for repeat, in the meantime wear CTS brace and other conservative measures. Similarly the right toe numbess may be a very mild peroneal neuropathy too mild for detection on emg/ncs if it worsens I recommend ultrasound of the knee and xray of the knee to rule out any cysts or bony abnormalities but given the trigger of crossing legs may have been a compression or stretch neuropathy; clinical monitoring recommended. No suggestion of cervical or lumbar radiculopathy at this time on exam or testing however again if very mild may evade detection on this test, if starting to have more radicular symptoms in either the neck or the low back recommend MRI cervical spine and/or lumbar spine. Luckily MRI of the brain did not show any etiology such as stroke.

## 2019-07-22 NOTE — Procedures (Signed)
      Full Name: Mary Cordova Gender: Female MRN #: [id number] Date of Birth: 07/15/1955    Visit Date: 07/21/2019 09:00 Age: 64 Years Examining Physician: Elmer Boutelle, MD  Referring Physician: Carol Webb, MD  History: Paresthesias and pain of the right hand digits 1-3 and palm as well as right after she "pulled something" and had a cramp she experience tingling in a small patch of area the size of her palm above the right knee and nubnesss in toes 1-2. MRI of the brain was normal, also reported some sensory changes in the face. She saw orthopaedic surgery in the past, diagnosed with primary arthritis of carpometacacarpal joints and was sent to therapy.   Summary: EMG/NCS performed on the right upper and right lower extremities. All nerves and muscles (as indicated in the following tables) were within normal limits.       Conclusion: This is a normal study of the right upper and lower extremity.  No electrophysiologic evidence for mononeuropathy, polyneuropathy, radiculopathy or muscle disease.  Mary Cordova M.D.  [facility] [address] [place], [place] [address] Tel: [phone] Fax: [phone]         MNC    Nerve / Sites Muscle Latency Ref. Amplitude Ref. Rel Amp Segments Distance Velocity Ref. Area    ms ms mV mV %  cm m/s m/s mVms  R Median - APB     Wrist APB 2.9 ?4.4 8.7 ?4.0 100 Wrist - APB 7   29.4     Upper arm APB 6.3  7.7  88.2 Upper arm - Wrist 19 56 ?49 25.5  R Ulnar - ADM     Wrist ADM 2.4 ?3.3 11.5 ?6.0 100 Wrist - ADM 7   24.9     B.Elbow ADM 5.3  10.1  88.4 B.Elbow - Wrist 18 61 ?49 23.0     A.Elbow ADM 7.0  10.0  98.4 A.Elbow - B.Elbow 10 59 ?49 24.0         A.Elbow - Wrist      R Peroneal - EDB     Ankle EDB 4.9 ?6.5 5.2 ?2.0 100 Ankle - EDB 9   20.5     Fib head EDB 9.8  4.7  91 Fib head - Ankle 26 52 ?44 19.8     Pop fossa EDB 11.7  4.6  96.9 Pop fossa - Fib head 10 53 ?44 19.2         Pop fossa - Ankle      R Tibial - AH     Ankle AH 4.1 ?5.8 11.7 ?4.0 100 Ankle - AH 9   31.0     Pop fossa AH 11.6  11.2  95.4 Pop fossa - Ankle 32 43 ?41 33.0             SNC    Nerve / Sites Rec. Site Peak Lat Ref.  Amp Ref. Segments Distance Peak Diff Ref.    ms ms V V  cm ms ms  R Sural - Ankle (Calf)     Calf Ankle 3.8 ?4.4 9 ?6 Calf - Ankle 14    R Superficial peroneal - Ankle     Lat leg Ankle 3.8 ?4.4 7 ?6 Lat leg - Ankle 14    R Median, Ulnar - Transcarpal comparison     Median Palm Wrist 1.9 ?2.2 103 ?35 Median Palm - Wrist 8       Ulnar Palm Wrist 1.9 ?2.2 32 ?  12 Ulnar Palm - Wrist 8          Median Palm - Ulnar Palm  0.1 ?0.4  R Median - Orthodromic (Dig II, Mid palm)     Dig II Wrist 2.8 ?3.4 19 ?10 Dig II - Wrist 13    R Ulnar - Orthodromic, (Dig V, Mid palm)     Dig V Wrist 2.6 ?3.1 9 ?5 Dig V - Wrist 11                 F  Wave    Nerve F Lat Ref.   ms ms  R Tibial - AH 48.6 ?56.0  R Ulnar - ADM 24.8 ?32.0         EMG Summary Table    Spontaneous MUAP Recruitment  Muscle IA Fib PSW Fasc Other Amp Dur. Poly Pattern  R. Deltoid Normal None None None _______ Normal Normal Normal Normal  R. Triceps brachii Normal None None None _______ Normal Normal Normal Normal  R. Pronator teres Normal None None None _______ Normal Normal Normal Normal  R. First dorsal interosseous Normal None None None _______ Normal Normal Normal Normal  R. Opponens pollicis Normal None None None _______ Normal Normal Normal Normal  R. Cervical paraspinals (low) Normal None None None _______ Normal Normal Normal Normal  R. Vastus medialis Normal None None None _______ Normal Normal Normal Normal  R. Tibialis anterior Normal None None None _______ Normal Normal Normal Normal  R. Gastrocnemius (Medial head) Normal None None None _______ Normal Normal Normal Normal  R. Peroneus longus Normal None None None _______ Normal Normal Normal Normal  R. Extensor hallucis longus Normal None None None _______ Normal Normal Normal Normal  R.  Biceps femoris (long head) Normal None None None _______ Normal Normal Normal Normal  R. Lumbar paraspinals (low) Normal None None None _______ Normal Normal Normal Normal      

## 2019-07-22 NOTE — Progress Notes (Signed)
Full Name: Mary Cordova Gender: Female MRN #: [id number] Date of Birth: 1955/08/11    Visit Date: 07/21/2019 09:00 Age: 64 Years Examining Physician: Sarina Ill, MD  Referring Physician: Maurice Small, MD  History: Paresthesias and pain of the right hand digits 1-3 and palm as well as right after she "pulled something" and had a cramp she experience tingling in a small patch of area the size of her palm above the right knee and nubnesss in toes 1-2. MRI of the brain was normal, also reported some sensory changes in the face. She saw orthopaedic surgery in the past, diagnosed with primary arthritis of carpometacacarpal joints and was sent to therapy.   Summary: EMG/NCS performed on the right upper and right lower extremities. All nerves and muscles (as indicated in the following tables) were within normal limits.       Conclusion: This is a normal study of the right upper and lower extremity.  No electrophysiologic evidence for mononeuropathy, polyneuropathy, radiculopathy or muscle disease.  Sarina Ill M.D.  [facility] [place], [place] [address] Tel: [phone] Fax: [phone]         [facility]    Nerve / Sites Muscle Latency Ref. Amplitude Ref. Rel Amp Segments Distance Velocity Ref. Area    ms ms mV mV %  cm m/s m/s mVms  R Median - APB     Wrist APB 2.9 ?4.4 8.7 ?4.0 100 Wrist - APB 7   29.4     Upper arm APB 6.3  7.7  88.2 Upper arm - Wrist 19 56 ?49 25.5  R Ulnar - ADM     Wrist ADM 2.4 ?3.3 11.5 ?6.0 100 Wrist - ADM 7   24.9     B.Elbow ADM 5.3  10.1  88.4 B.Elbow - Wrist 18 61 ?49 23.0     A.Elbow ADM 7.0  10.0  98.4 A.Elbow - B.Elbow 10 59 ?49 24.0         A.Elbow - Wrist      R Peroneal - EDB     Ankle EDB 4.9 ?6.5 5.2 ?2.0 100 Ankle - EDB 9   20.5     Fib head EDB 9.8  4.7  91 Fib head - Ankle 26 52 ?44 19.8     Pop fossa EDB 11.7  4.6  96.9 Pop fossa - Fib head 10 53 ?44 19.2         Pop fossa - Ankle      R Tibial - AH     Ankle AH 4.1 ?5.8 11.7 ?4.0 100 Ankle - AH 9   31.0     Pop fossa AH 11.6  11.2  95.4 Pop fossa - Ankle 32 43 ?41 33.0             SNC    Nerve / Sites Rec. Site Peak Lat Ref.  Amp Ref. Segments Distance Peak Diff Ref.    ms ms V V  cm ms ms  R Sural - Ankle (Calf)     Calf Ankle 3.8 ?4.4 9 ?6 Calf - Ankle 14    R Superficial peroneal - Ankle     Lat leg Ankle 3.8 ?4.4 7 ?6 Lat leg - Ankle 14    R Median, Ulnar - Transcarpal comparison     Median Palm Wrist 1.9 ?2.2 103 ?35 Median Palm - Wrist 8       Ulnar Palm Wrist 1.9 ?2.2 32 ?  12 Ulnar Palm - Wrist 8          Median Palm - Ulnar Palm  0.1 ?0.4  R Median - Orthodromic (Dig II, Mid palm)     Dig II Wrist 2.8 ?3.4 19 ?10 Dig II - Wrist 13    R Ulnar - Orthodromic, (Dig V, Mid palm)     Dig V Wrist 2.6 ?3.1 9 ?5 Dig V - Wrist 38                 F  Wave    Nerve F Lat Ref.   ms ms  R Tibial - AH 48.6 ?56.0  R Ulnar - ADM 24.8 ?32.0         EMG Summary Table    Spontaneous MUAP Recruitment  Muscle IA Fib PSW Fasc Other Amp Dur. Poly Pattern  R. Deltoid Normal None None None _______ Normal Normal Normal Normal  R. Triceps brachii Normal None None None _______ Normal Normal Normal Normal  R. Pronator teres Normal None None None _______ Normal Normal Normal Normal  R. First dorsal interosseous Normal None None None _______ Normal Normal Normal Normal  R. Opponens pollicis Normal None None None _______ Normal Normal Normal Normal  R. Cervical paraspinals (low) Normal None None None _______ Normal Normal Normal Normal  R. Vastus medialis Normal None None None _______ Normal Normal Normal Normal  R. Tibialis anterior Normal None None None _______ Normal Normal Normal Normal  R. Gastrocnemius (Medial head) Normal None None None _______ Normal Normal Normal Normal  R. Peroneus longus Normal None None None _______ Normal Normal Normal Normal  R. Extensor hallucis longus Normal None None None _______ Normal Normal Normal Normal  R.  Biceps femoris (long head) Normal None None None _______ Normal Normal Normal Normal  R. Lumbar paraspinals (low) Normal None None None _______ Normal Normal Normal Normal

## 2019-07-22 NOTE — Progress Notes (Signed)
See procedure note.

## 2019-08-29 ENCOUNTER — Other Ambulatory Visit: Payer: Self-pay | Admitting: Family Medicine

## 2019-08-29 ENCOUNTER — Ambulatory Visit: Payer: Medicare Other

## 2019-08-29 DIAGNOSIS — Z1231 Encounter for screening mammogram for malignant neoplasm of breast: Secondary | ICD-10-CM

## 2019-10-06 ENCOUNTER — Other Ambulatory Visit: Payer: Self-pay

## 2019-10-06 ENCOUNTER — Ambulatory Visit
Admission: RE | Admit: 2019-10-06 | Discharge: 2019-10-06 | Disposition: A | Discharge status: Home / Self Care | Payer: Medicare Other | Source: Ambulatory Visit | Attending: Family Medicine | Admitting: Family Medicine

## 2019-10-06 DIAGNOSIS — Z1231 Encounter for screening mammogram for malignant neoplasm of breast: Secondary | ICD-10-CM

## 2019-10-07 ENCOUNTER — Other Ambulatory Visit: Payer: Self-pay | Admitting: Family Medicine

## 2019-10-07 DIAGNOSIS — R928 Other abnormal and inconclusive findings on diagnostic imaging of breast: Secondary | ICD-10-CM

## 2019-10-14 ENCOUNTER — Other Ambulatory Visit: Payer: Self-pay

## 2019-10-14 ENCOUNTER — Ambulatory Visit
Admission: RE | Admit: 2019-10-14 | Discharge: 2019-10-14 | Disposition: A | Discharge status: Home / Self Care | Payer: Medicare Other | Source: Ambulatory Visit | Attending: Family Medicine | Admitting: Family Medicine

## 2019-10-14 ENCOUNTER — Other Ambulatory Visit: Payer: Self-pay | Admitting: Family Medicine

## 2019-10-14 DIAGNOSIS — N6001 Solitary cyst of right breast: Secondary | ICD-10-CM

## 2019-10-14 DIAGNOSIS — R928 Other abnormal and inconclusive findings on diagnostic imaging of breast: Secondary | ICD-10-CM

## 2020-04-17 ENCOUNTER — Other Ambulatory Visit: Payer: Medicare Other

## 2020-04-19 ENCOUNTER — Other Ambulatory Visit: Payer: Self-pay | Admitting: Nurse Practitioner

## 2020-04-19 DIAGNOSIS — U071 COVID-19: Secondary | ICD-10-CM

## 2020-04-19 NOTE — Progress Notes (Signed)
I connected by phone with Mary Cordova on 04/19/2020 at 1:00 PM to discuss the potential use of a treatment for mild to moderate COVID-19 viral infection in non-hospitalized patients.  This patient is a 64 y.o. female that meets the FDA criteria for Emergency Use Authorization of bamlanivimab/etesevimab, casirivimab\imdevimab, or sotrovimab  Has a (+) direct SARS-CoV-2 viral test result  Has mild or moderate COVID-19   Is ? 65 years of age and weighs ? 40 kg  Is NOT hospitalized due to COVID-19  Is NOT requiring oxygen therapy or requiring an increase in baseline oxygen flow rate due to COVID-19  Is within 10 days of symptom onset  Has at least one of the high risk factor(s) for progression to severe COVID-19 and/or hospitalization as defined in EUA.  Specific high risk criteria : Older age (>/= 65 yo), BMI > 25 and Cardiovascular disease or hypertension   I have spoken and communicated the following to the patient or parent/caregiver:  1. FDA has authorized the emergency use of bamlanivimab/etesevimab, casirivimab\imdevimab, or sotrovimab for the treatment of mild to moderate COVID-19 in adults and pediatric patients with positive results of direct SARS-CoV-2 viral testing who are 54 years of age and older weighing at least 40 kg, and who are at high risk for progressing to severe COVID-19 and/or hospitalization.  2. The significant known and potential risks and benefits of bamlanivimab/etesevimab, casirivimab\imdevimab, or sotrovimab, and the extent to which such potential risks and benefits are unknown.  3. Information on available alternative treatments and the risks and benefits of those alternatives, including clinical trials.  4. Patients treated with bamlanivimab/etesevimab, casirivimab\imdevimab, or sotrovimab should continue to self-isolate and use infection control measures (e.g., wear mask, isolate, social distance, avoid sharing personal items, clean and disinfect "high  touch" surfaces, and frequent handwashing) according to CDC guidelines.   5. The patient or parent/caregiver has the option to accept or refuse bamlanivimab/etesevimab, casirivimab\imdevimab, or sotrovimab.  After reviewing this information with the patient, the patient has agreed to receive one of the available covid 19 monoclonal antibodies and will be provided an appropriate fact sheet prior to infusion.[name], DNP, AGNP-C [phone] (Infusion Center Hotline)

## 2020-04-20 ENCOUNTER — Ambulatory Visit (HOSPITAL_COMMUNITY)
Admission: RE | Admit: 2020-04-20 | Discharge: 2020-04-20 | Disposition: A | Discharge status: Home / Self Care | Payer: Medicare Other | Source: Ambulatory Visit | Attending: Pulmonary Disease | Admitting: Pulmonary Disease

## 2020-04-20 DIAGNOSIS — Z23 Encounter for immunization: Secondary | ICD-10-CM | POA: Insufficient documentation

## 2020-04-20 DIAGNOSIS — U071 COVID-19: Secondary | ICD-10-CM | POA: Diagnosis present

## 2020-04-20 MED ORDER — METHYLPREDNISOLONE SODIUM SUCC 125 MG IJ SOLR: 125.0000 mg | Freq: Once | INTRAMUSCULAR | Status: DC | PRN

## 2020-04-20 MED ORDER — FAMOTIDINE IN NACL 20-0.9 MG/50ML-% IV SOLN: 20.0000 mg | Freq: Once | INTRAVENOUS | Status: DC | PRN

## 2020-04-20 MED ORDER — ALBUTEROL SULFATE HFA 108 (90 BASE) MCG/ACT IN AERS: 2.0000 | INHALATION_SPRAY | Freq: Once | RESPIRATORY_TRACT | Status: DC | PRN

## 2020-04-20 MED ORDER — EPINEPHRINE 0.3 MG/0.3ML IJ SOAJ: 0.3000 mg | Freq: Once | INTRAMUSCULAR | Status: DC | PRN

## 2020-04-20 MED ORDER — DIPHENHYDRAMINE HCL 50 MG/ML IJ SOLN: 50.0000 mg | Freq: Once | INTRAMUSCULAR | Status: DC | PRN

## 2020-04-20 MED ORDER — SOTROVIMAB 500 MG/8ML IV SOLN
500.0000 mg | Freq: Once | INTRAVENOUS | Status: AC
  Administered 2020-04-20: 500 mg via INTRAVENOUS

## 2020-04-20 MED ORDER — SODIUM CHLORIDE 0.9 % IV SOLN: INTRAVENOUS | Status: DC | PRN

## 2020-04-20 NOTE — Progress Notes (Signed)
Diagnosis: COVID-19  Physician: Dr. Patrick Wright  Procedure: Covid Infusion Clinic Med: Sotrovimab infusion - Provided patient with sotrovimab fact sheet for patients, parents, and caregivers prior to infusion.   Complications: No immediate complications noted  Discharge: Discharged home    

## 2020-04-20 NOTE — Discharge Instructions (Signed)
10 Things You Can Do to Manage Your COVID-19 Symptoms at Home If you have possible or confirmed COVID-19: 1. Stay home from work and school. And stay away from other public places. If you must go out, avoid using any kind of public transportation, ridesharing, or taxis. 2. Monitor your symptoms carefully. If your symptoms get worse, call your healthcare provider immediately. 3. Get rest and stay hydrated. 4. If you have a medical appointment, call the healthcare provider ahead of time and tell them that you have or may have COVID-19. 5. For medical emergencies, call 911 and notify the dispatch personnel that you have or may have COVID-19. 6. Cover your cough and sneezes with a tissue or use the inside of your elbow. 7. Wash your hands often with soap and water for at least 20 seconds or clean your hands with an alcohol-based hand sanitizer that contains at least 60% alcohol. 8. As much as possible, stay in a specific room and away from other people in your home. Also, you should use a separate bathroom, if available. If you need to be around other people in or outside of the home, wear a mask. 9. Avoid sharing personal items with other people in your household, like dishes, towels, and bedding. 10. Clean all surfaces that are touched often, like counters, tabletops, and doorknobs. Use household cleaning sprays or wipes according to the label instructions. cdc.gov/coronavirus 11/17/2018 This information is not intended to replace advice given to you by your health care provider. Make sure you discuss any questions you have with your health care provider. Document Revised: 04/21/2019 Document Reviewed: 04/21/2019 Elsevier Patient Education  2020 [employer] What types of side effects do monoclonal antibody drugs cause?  Common side effects  In general, the more common side effects caused by monoclonal antibody drugs include: . Allergic reactions, such as hives or itching . Flu-like signs and  symptoms, including chills, fatigue, fever, and muscle aches and pains . Nausea, vomiting . Diarrhea . Skin rashes . Low blood pressure   The CDC is recommending patients who receive monoclonal antibody treatments wait at least 90 days before being vaccinated.  Currently, there are no data on the safety and efficacy of mRNA COVID-19 vaccines in persons who received monoclonal antibodies or convalescent plasma as part of COVID-19 treatment. Based on the estimated half-life of such therapies as well as evidence suggesting that reinfection is uncommon in the 90 days after initial infection, vaccination should be deferred for at least 90 days, as a precautionary measure until additional information becomes available, to avoid interference of the antibody treatment with vaccine-induced immune responses. If you have any questions or concerns after the infusion please call the Advanced Practice Provider on call at [phone]. This number is ONLY intended for your use regarding questions or concerns about the infusion post-treatment side-effects.  Please do not provide this number to others for use. For return to work notes please contact your primary care provider.   If someone you know is interested in receiving treatment please have them call the COVID hotline at [phone].   

## 2020-04-20 NOTE — Progress Notes (Signed)
Patient reviewed Fact Sheet for Patients, Parents, and Caregivers for Emergency Use Authorization (EUA) of Sotrovimab for the Treatment of Coronavirus. Patient also reviewed and is agreeable to the estimated cost of treatment. Patient is agreeable to proceed.   

## 2020-04-25 ENCOUNTER — Other Ambulatory Visit: Payer: Medicare Other

## 2020-05-25 ENCOUNTER — Other Ambulatory Visit: Payer: Medicare Other

## 2020-07-05 ENCOUNTER — Ambulatory Visit
Admission: RE | Admit: 2020-07-05 | Discharge: 2020-07-05 | Disposition: A | Discharge status: Home / Self Care | Payer: Medicare Other | Source: Ambulatory Visit | Attending: Family Medicine | Admitting: Family Medicine

## 2020-07-05 ENCOUNTER — Other Ambulatory Visit: Payer: Self-pay | Admitting: Family Medicine

## 2020-07-05 ENCOUNTER — Other Ambulatory Visit: Payer: Self-pay

## 2020-07-05 DIAGNOSIS — N6001 Solitary cyst of right breast: Secondary | ICD-10-CM

## 2020-08-25 IMAGING — MG DIGITAL SCREENING BILAT W/ TOMO W/ CAD
8 series · 8 of 24 positions shown · non-contrast
Comparison: Previous exam(s).

CLINICAL DATA: Screening.

EXAM:
DIGITAL SCREENING BILATERAL MAMMOGRAM WITH TOMO AND CAD

[R MLO synth-2D]
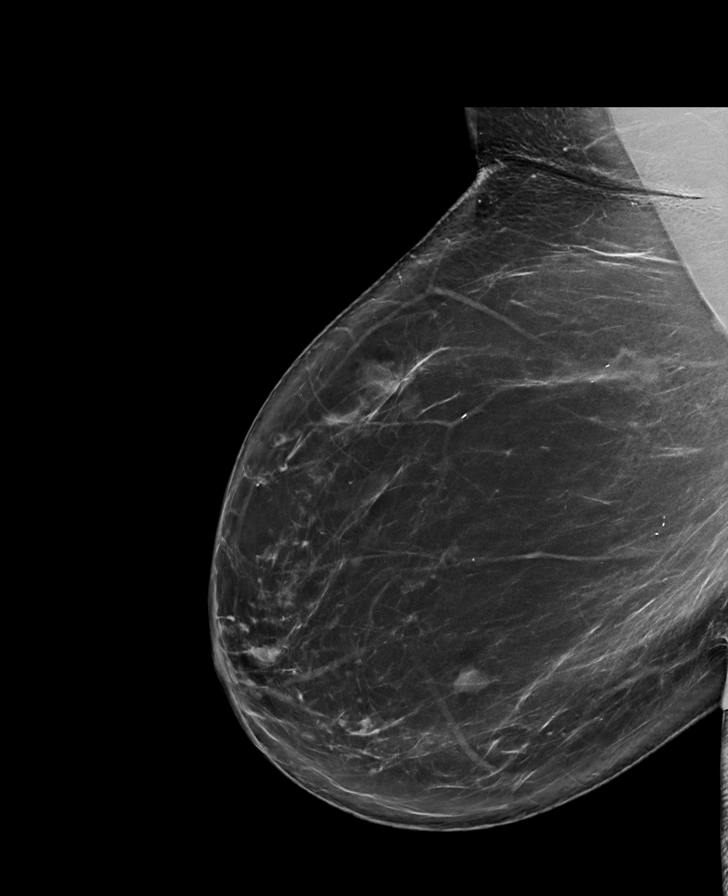

[L MLO synth-2D]
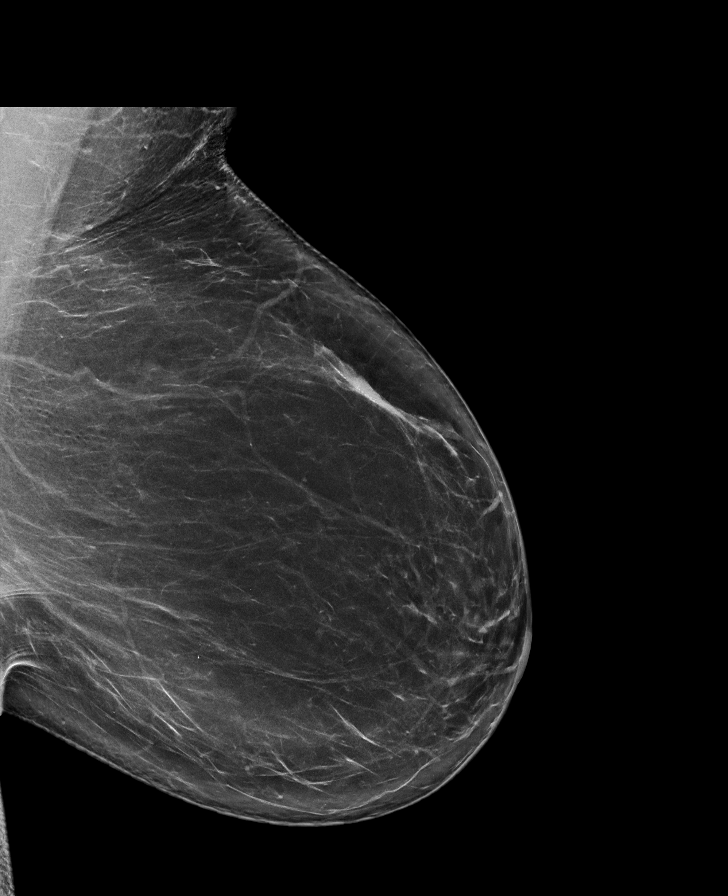

[R CC synth-2D]
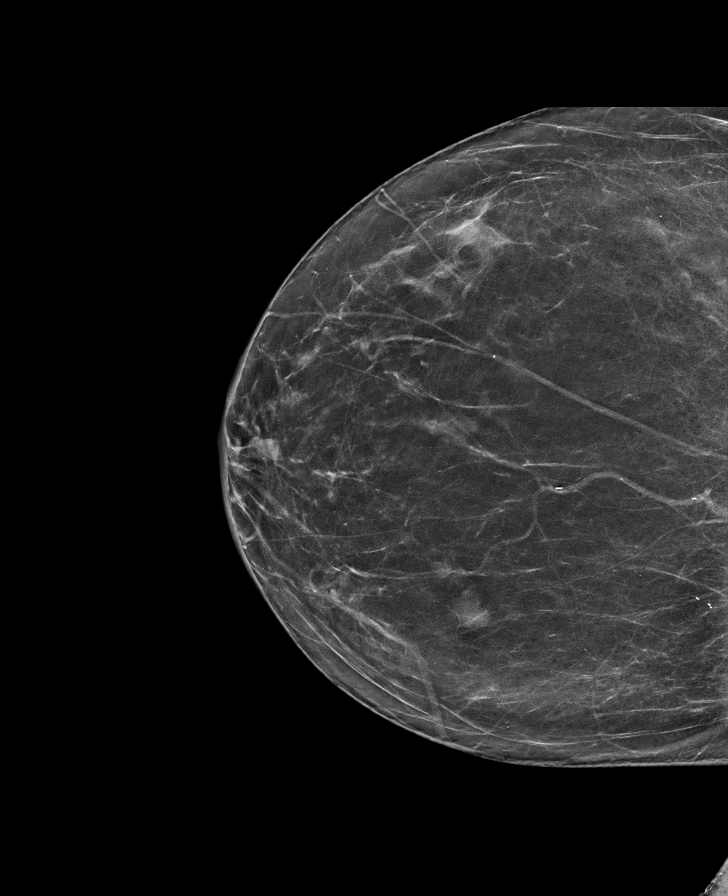

[L CC synth-2D]
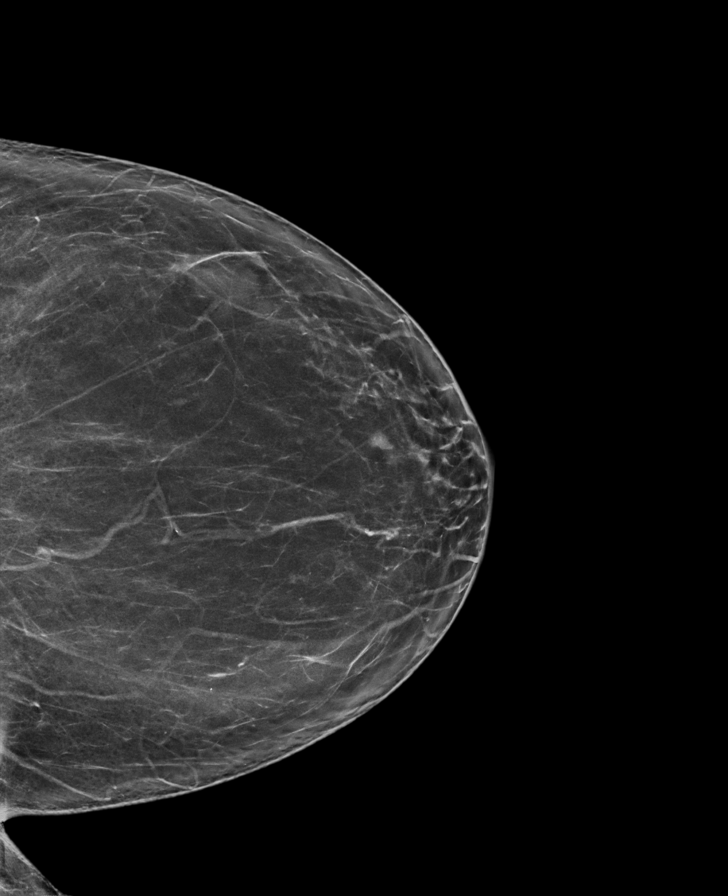

[L CC tomo · tomo slice 37/74.0]
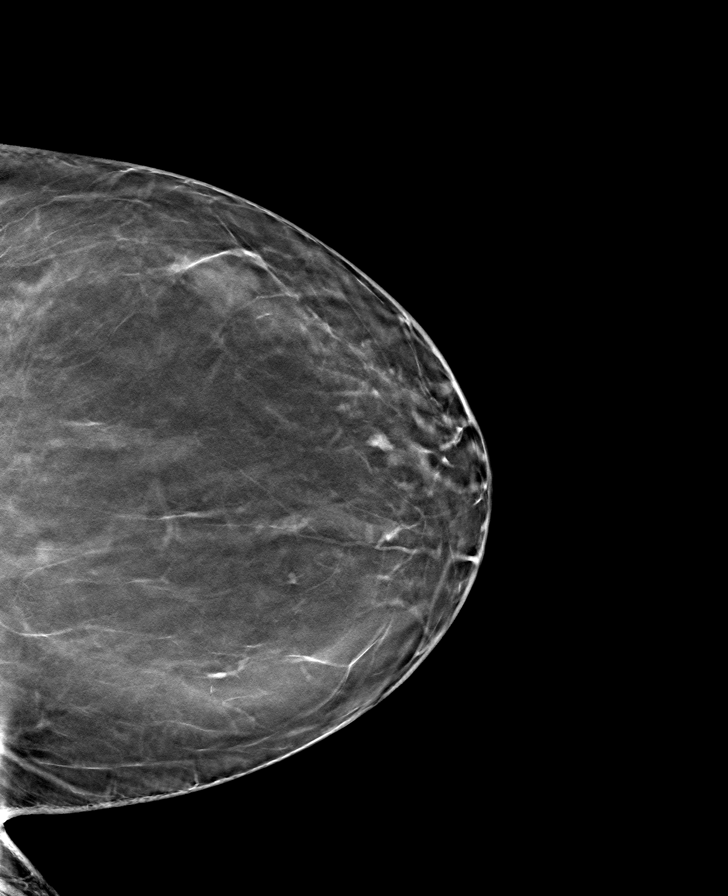

[R CC tomo · tomo slice 35/70.0]
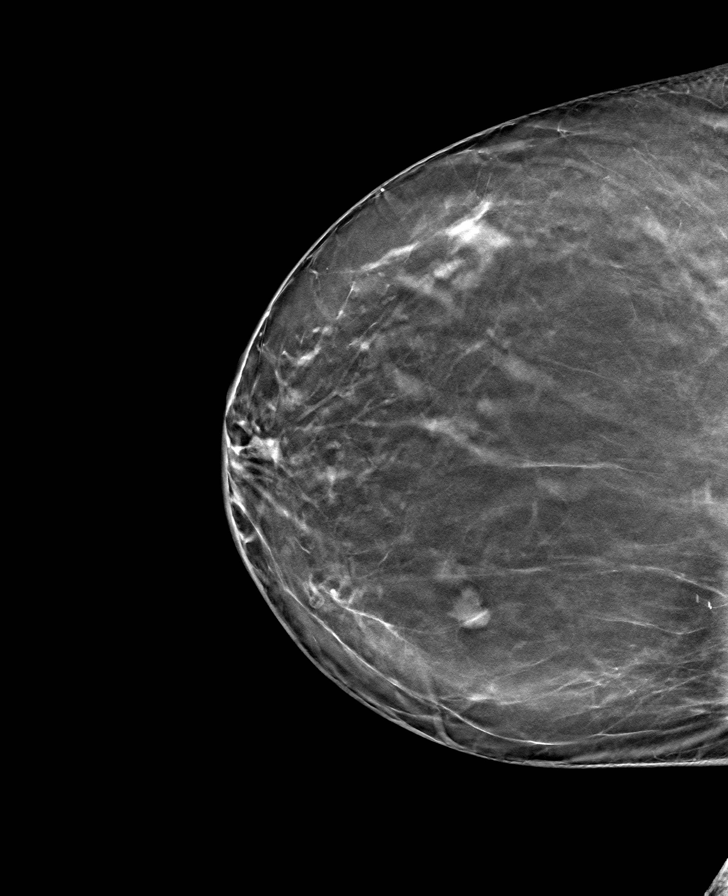

[R MLO tomo · tomo slice 46/91.0]
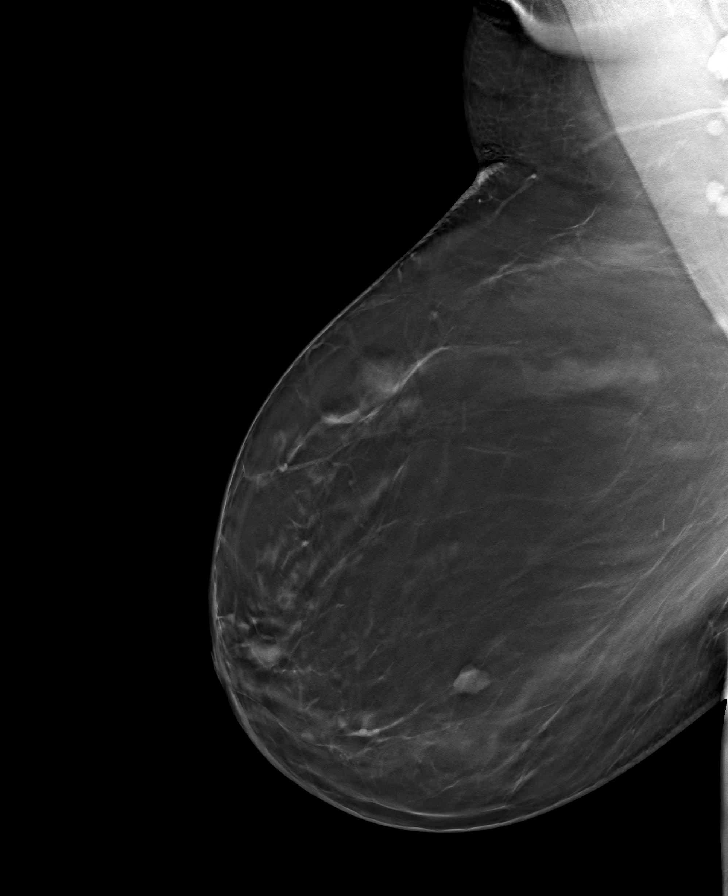

[L MLO tomo · tomo slice 44/87.0]
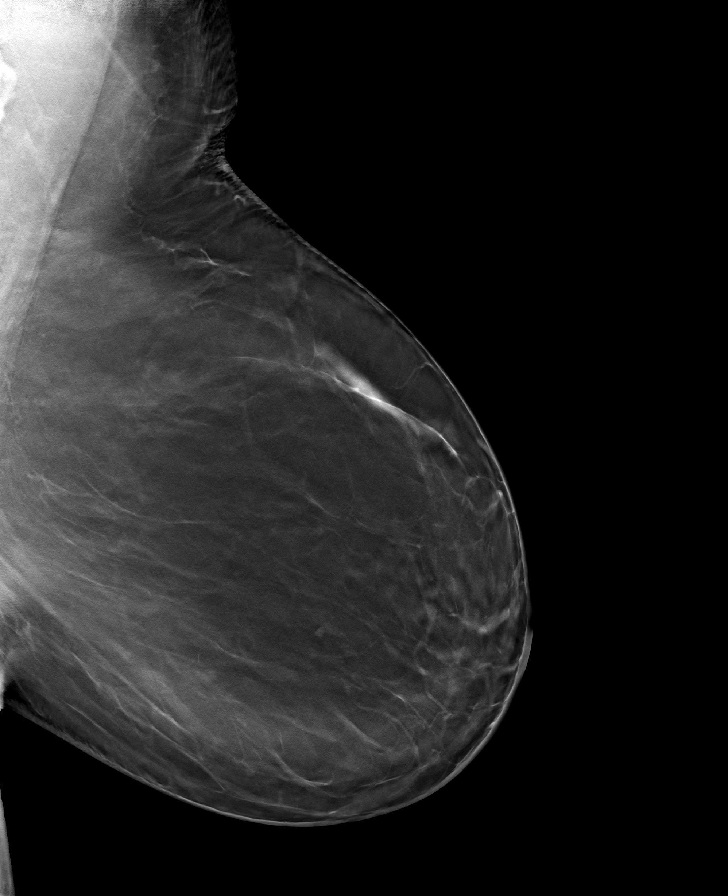

[8 of 24 positions shown; findings below may reference images not displayed]

ACR Breast Density Category b: There are scattered areas of
fibroglandular density.
FINDINGS: In the right breast, a possible mass warrants further evaluation. In
the left breast, no findings suspicious for malignancy. Images were
processed with CAD.
IMPRESSION: Further evaluation is suggested for possible mass in the right
breast.

RECOMMENDATION:
Ultrasound of the right breast. (Code:[id number])

The patient will be contacted regarding the findings, and additional
imaging will be scheduled.

BI-RADS CATEGORY  0: Incomplete. Need additional imaging evaluation
and/or prior mammograms for comparison.

## 2020-10-08 ENCOUNTER — Other Ambulatory Visit: Payer: Self-pay

## 2020-10-08 ENCOUNTER — Ambulatory Visit
Admission: RE | Admit: 2020-10-08 | Discharge: 2020-10-08 | Disposition: A | Discharge status: Home / Self Care | Payer: Medicare Other | Source: Ambulatory Visit | Attending: Family Medicine | Admitting: Family Medicine

## 2020-10-08 DIAGNOSIS — N6001 Solitary cyst of right breast: Secondary | ICD-10-CM

## 2022-01-28 ENCOUNTER — Other Ambulatory Visit: Payer: Self-pay | Admitting: Family Medicine

## 2022-01-28 DIAGNOSIS — Z09 Encounter for follow-up examination after completed treatment for conditions other than malignant neoplasm: Secondary | ICD-10-CM

## 2022-02-10 ENCOUNTER — Ambulatory Visit
Admission: RE | Admit: 2022-02-10 | Discharge: 2022-02-10 | Disposition: A | Discharge status: Home / Self Care | Payer: Medicare Other | Source: Ambulatory Visit | Attending: Family Medicine | Admitting: Family Medicine

## 2022-02-10 DIAGNOSIS — Z09 Encounter for follow-up examination after completed treatment for conditions other than malignant neoplasm: Secondary | ICD-10-CM

## 2022-02-12 ENCOUNTER — Other Ambulatory Visit: Payer: Self-pay | Admitting: Family Medicine

## 2022-02-12 DIAGNOSIS — E2839 Other primary ovarian failure: Secondary | ICD-10-CM

## 2022-08-15 ENCOUNTER — Ambulatory Visit
Admission: RE | Admit: 2022-08-15 | Discharge: 2022-08-15 | Disposition: A | Discharge status: Home / Self Care | Payer: Medicare Other | Source: Ambulatory Visit | Attending: Family Medicine | Admitting: Family Medicine

## 2022-08-15 DIAGNOSIS — E2839 Other primary ovarian failure: Secondary | ICD-10-CM

## 2023-02-11 ENCOUNTER — Other Ambulatory Visit: Payer: Self-pay | Admitting: Family Medicine

## 2023-02-11 DIAGNOSIS — Z1231 Encounter for screening mammogram for malignant neoplasm of breast: Secondary | ICD-10-CM

## 2023-02-16 ENCOUNTER — Other Ambulatory Visit (HOSPITAL_COMMUNITY): Payer: Self-pay | Admitting: Family Medicine

## 2023-02-16 DIAGNOSIS — E782 Mixed hyperlipidemia: Secondary | ICD-10-CM

## 2023-02-24 ENCOUNTER — Ambulatory Visit (HOSPITAL_COMMUNITY)
Admission: RE | Admit: 2023-02-24 | Discharge: 2023-02-24 | Disposition: A | Discharge status: Home / Self Care | Payer: Medicare Other | Source: Ambulatory Visit | Attending: Family Medicine | Admitting: Family Medicine

## 2023-02-24 DIAGNOSIS — E782 Mixed hyperlipidemia: Secondary | ICD-10-CM

## 2023-03-11 ENCOUNTER — Ambulatory Visit
Admission: RE | Admit: 2023-03-11 | Discharge: 2023-03-11 | Disposition: A | Discharge status: Home / Self Care | Payer: Medicare Other | Source: Ambulatory Visit | Attending: Family Medicine | Admitting: Family Medicine

## 2023-03-11 DIAGNOSIS — Z1231 Encounter for screening mammogram for malignant neoplasm of breast: Secondary | ICD-10-CM

## 2023-03-13 ENCOUNTER — Other Ambulatory Visit: Payer: Self-pay | Admitting: Family Medicine

## 2023-03-13 DIAGNOSIS — R935 Abnormal findings on diagnostic imaging of other abdominal regions, including retroperitoneum: Secondary | ICD-10-CM

## 2023-03-19 ENCOUNTER — Ambulatory Visit
Admission: RE | Admit: 2023-03-19 | Discharge: 2023-03-19 | Disposition: A | Discharge status: Home / Self Care | Payer: Medicare Other | Source: Ambulatory Visit | Attending: Family Medicine | Admitting: Family Medicine

## 2023-03-19 DIAGNOSIS — R935 Abnormal findings on diagnostic imaging of other abdominal regions, including retroperitoneum: Secondary | ICD-10-CM

## 2024-03-02 ENCOUNTER — Other Ambulatory Visit: Payer: Self-pay | Admitting: Family Medicine

## 2024-03-02 DIAGNOSIS — Z1231 Encounter for screening mammogram for malignant neoplasm of breast: Secondary | ICD-10-CM

## 2024-03-24 ENCOUNTER — Ambulatory Visit
Admission: RE | Admit: 2024-03-24 | Discharge: 2024-03-24 | Disposition: A | Discharge status: Home / Self Care | Source: Ambulatory Visit | Attending: Family Medicine | Admitting: Family Medicine

## 2024-03-24 DIAGNOSIS — Z1231 Encounter for screening mammogram for malignant neoplasm of breast: Secondary | ICD-10-CM
